# Patient Record
Sex: Female | Born: 1965 | Race: White | Hispanic: No | Marital: Married | Smoking: Former smoker
Health system: Southern US, Community
[De-identification: ages and names within clinical notes are randomized; demographics above are authoritative.]

## PROBLEM LIST (undated history)

## (undated) DIAGNOSIS — E039 Hypothyroidism, unspecified: Secondary | ICD-10-CM

## (undated) DIAGNOSIS — N390 Urinary tract infection, site not specified: Secondary | ICD-10-CM

## (undated) DIAGNOSIS — Z8619 Personal history of other infectious and parasitic diseases: Secondary | ICD-10-CM

## (undated) HISTORY — DX: Personal history of other infectious and parasitic diseases: Z86.19

## (undated) HISTORY — PX: TUMOR REMOVAL: SHX12

## (undated) HISTORY — DX: Urinary tract infection, site not specified: N39.0

## (undated) HISTORY — DX: Hypothyroidism, unspecified: E03.9

## (undated) HISTORY — PX: ILEOSTOMY: SHX1783

---

## 2003-02-27 ENCOUNTER — Emergency Department (HOSPITAL_COMMUNITY): Admission: EM | Admit: 2003-02-27 | Discharge: 2003-02-27 | Payer: Self-pay | Admitting: Emergency Medicine

## 2003-02-28 ENCOUNTER — Emergency Department (HOSPITAL_COMMUNITY): Admission: EM | Admit: 2003-02-28 | Discharge: 2003-02-28 | Payer: Self-pay | Admitting: Emergency Medicine

## 2005-01-08 ENCOUNTER — Emergency Department (HOSPITAL_COMMUNITY): Admission: EM | Admit: 2005-01-08 | Discharge: 2005-01-08 | Payer: Self-pay | Admitting: Emergency Medicine

## 2011-02-22 ENCOUNTER — Emergency Department (HOSPITAL_COMMUNITY)
Admission: EM | Admit: 2011-02-22 | Discharge: 2011-02-22 | Disposition: A | Discharge status: Home / Self Care | Payer: BC Managed Care – PPO | Attending: Emergency Medicine | Admitting: Emergency Medicine

## 2011-02-22 ENCOUNTER — Emergency Department (HOSPITAL_COMMUNITY): Payer: BC Managed Care – PPO

## 2011-02-22 DIAGNOSIS — R143 Flatulence: Secondary | ICD-10-CM | POA: Insufficient documentation

## 2011-02-22 DIAGNOSIS — R141 Gas pain: Secondary | ICD-10-CM | POA: Insufficient documentation

## 2011-02-22 DIAGNOSIS — R109 Unspecified abdominal pain: Secondary | ICD-10-CM | POA: Insufficient documentation

## 2011-02-22 DIAGNOSIS — R142 Eructation: Secondary | ICD-10-CM | POA: Insufficient documentation

## 2011-02-22 DIAGNOSIS — C569 Malignant neoplasm of unspecified ovary: Secondary | ICD-10-CM | POA: Insufficient documentation

## 2011-02-22 DIAGNOSIS — R10815 Periumbilic abdominal tenderness: Secondary | ICD-10-CM | POA: Insufficient documentation

## 2011-02-22 LAB — COMPREHENSIVE METABOLIC PANEL
ALT: 13 U/L (ref 0–35)
Calcium: 9.1 mg/dL (ref 8.4–10.5)
Creatinine, Ser: 0.74 mg/dL (ref 0.4–1.2)
GFR calc Af Amer: >60 mL/min (ref >60)
Glucose, Bld: 96 mg/dL (ref 70–99)
Sodium: 136 mEq/L (ref 135–145)
Total Protein: 6.8 g/dL (ref 6.0–8.3)

## 2011-02-22 LAB — URINALYSIS, ROUTINE W REFLEX MICROSCOPIC
Glucose, UA: NEGATIVE mg/dL
Ketones, ur: NEGATIVE mg/dL
Specific Gravity, Urine: 1.01 (ref 1.005–1.030)
pH: 8 (ref 5.0–8.0)

## 2011-02-22 LAB — CBC
HCT: 42.2 % (ref 36.0–46.0)
MCHC: 35.3 g/dL (ref 30.0–36.0)
RDW: 13.2 % (ref 11.5–15.5)

## 2011-02-22 LAB — DIFFERENTIAL
Basophils Absolute: 0 10*3/uL (ref 0.0–0.1)
Basophils Relative: 0 % (ref 0–1)
Eosinophils Relative: 1 % (ref 0–5)
Monocytes Absolute: 0.3 10*3/uL (ref 0.1–1.0)

## 2011-03-01 ENCOUNTER — Ambulatory Visit: Payer: BC Managed Care – PPO | Attending: Gynecologic Oncology | Admitting: Gynecologic Oncology

## 2011-03-01 ENCOUNTER — Other Ambulatory Visit: Payer: Self-pay | Admitting: Gynecologic Oncology

## 2011-03-01 ENCOUNTER — Other Ambulatory Visit (HOSPITAL_COMMUNITY)
Admission: RE | Admit: 2011-03-01 | Discharge: 2011-03-01 | Disposition: A | Discharge status: Home / Self Care | Payer: BC Managed Care – PPO | Source: Ambulatory Visit | Attending: Gynecologic Oncology | Admitting: Gynecologic Oncology

## 2011-03-01 DIAGNOSIS — Z8540 Personal history of malignant neoplasm of unspecified female genital organ: Secondary | ICD-10-CM | POA: Insufficient documentation

## 2011-03-01 DIAGNOSIS — N814 Uterovaginal prolapse, unspecified: Secondary | ICD-10-CM | POA: Insufficient documentation

## 2011-03-01 DIAGNOSIS — R1909 Other intra-abdominal and pelvic swelling, mass and lump: Secondary | ICD-10-CM | POA: Insufficient documentation

## 2011-03-01 DIAGNOSIS — N390 Urinary tract infection, site not specified: Secondary | ICD-10-CM | POA: Insufficient documentation

## 2011-03-05 NOTE — Consult Note (Signed)
[name], Khan                ACCOUNT NO.:  [id number]  MEDICAL RECORD NO.:  [id number]           PATIENT TYPE:  E  LOCATION:  [facility]                         FACILITY:  [facility]  PHYSICIAN:  Laurette Schimke, MD     DATE OF BIRTH:  [birth year]  DATE OF CONSULTATION: DATE OF DISCHARGE:  02/22/2011                                CONSULTATION   REASONS FOR CONSULT:  Consult is requested by Dr. Asencion Partridge for management of a pelvic mass.  HISTORY OF PRESENT ILLNESS:  This is a 45 year old gravida 2, para 2, last normal menstrual period approximately 1 month ago.  She is in her usual state of health until 6 weeks ago when she developed signs and symptoms of discomfort with increase in weight gain, initially, she presumed this was secondary to recurrent urinary tract infection and sought evaluation as such.  Given the progression of symptoms, a CT scan of the abdomen and pelvis was requested.  This imaging study demonstrated the presence of a 30 x 15 x 26 cm large cystic mass with multiple septations and thin enhancing mural nodules.  The mass compresses both mid to distal ureters resulting in moderate bilateral hydronephrosis.  There is no evidence of ascites or peritoneal nodularity.  No evidence of lymphadenopathy.  Simple cysts were noted within the lower segment of the liver.  [name] denies any nausea or vomiting or diarrhea.  She also denies constipation.  She reports increasing abdominal pressure with increasing difficulty and with mobilization and she has been having worsening pain over the last week.  PAST MEDICAL HISTORY: 1. 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 2. Current urinary tract infection. 3. Nonfunctioning bladder with urinary diversion.  MEDICATIONS: 1. Ciprofloxacin 500 mg daily. 2. Ibuprofen.  ALLERGIES:  AMOXICILLIN, which causes fever and weakness.  PAST SURGICAL HISTORY: 1. Ileal conduit created at the age of [age] secondary to a bladder  fracture.  No revisions have been necessary in the interim. 2. She reports history of appendectomy at the age of [age]. 3. Removal of a spinal tumor remotely.  PAST GYN HISTORY:  Menarche occurred at the age of 32 with regular menses.  Menses are heavier now, but do only last 1 day.  She has had two normal spontaneous vaginal deliveries, the first was 9 pounds the second was 6.3 pounds.  FAMILY HISTORY:  She denies a history of malignancy.  SOCIAL HISTORY:  She is unmarried for 13 years.  She has two children, who are alive and well.  She works as an Nature conservation officer.  REVIEW OF SYSTEMS:  No shortness of breath, cough, chest pain.  She has an ileal conduit, which has been functioning without any revisions.  She has intermittent urinary tract infections.  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  No history of a Pap test for approximately 11 years. Worsening lower abdominal pain, pelvic pressure, discomfort, difficulty ambulating.  PHYSICAL EXAMINATION:  VITAL SIGNS:  Weight 139 pounds, height 5 feet 7 inches, blood pressure 120/68, pulse of 76. GENERAL:  Well-developed and pleasant female, in no acute distress. CHEST:  Clear to auscultation. HEART:  Regular  rate and rhythm. ABDOMEN:  Markedly distended.  Mass palpable approximately 10 cm above the umbilicus.  A left paramedian incision is noted.  A right functioning ileostomy is appreciated. PELVIC EXAMINATION:  The cervix is displaced completely of the side introitus.  There it is reducible; however, patient has great discomfort with relocation of the  uterus and vagina into the pelvis, probably secondary to the presence of this mass.  The vaginal tissues are extremely keratinized.  No erosions or ulcerations are appreciated. Rectal examination is notable for good anal sphincter tone.  No nodular mass is seen.  No nodularity in the rectovaginal septum.  IMPRESSION:  [name] is a 45 year old with a 30-cm adnexal mass  and complete uterine prolapse.  PLAN:  Plan for this patient is for her to be evaluated by Dr. Ellene Route next week with the intent for a total abdominal hysterectomy, bilateral salpingo-oophorectomy with possible staging and other urinary support procedures as indicated on Tuesday, March 13, 2011 at [facility].  I am most appreciative to GYN-urology team for facilitating [place] evaluation on such short note.  Ms. Cafaro was counseled and advised against removal of the other ovary is within normal limits given the concerns regarding with respect to increase in mortality and the other conditions associated with early termination of ovarian function.  She, however, has a lot of apprehension regarding surgical procedures and would like to have one definitive procedure.  Risks of the procedure were discussed with her inclusive of infection, bleeding, damage to surrounding structures, prolonged hospitalization, reoperation.  All of her questions of those of her husband's were answered to satisfaction.     Laurette Schimke, MD     WB/MEDQ  D:  03/01/2011  T:  03/01/2011  Job:  [id number]  cc:   Misty Stanley M.D. Marisue Ivan, M.D.  Telford Nab, R.N. [address] [place], [place] [address]  Ellene Route, M.D.  Electronically Signed by Laurette Schimke MD on 03/05/2011 10:23:21 AM

## 2011-03-14 HISTORY — PX: TOTAL ABDOMINAL HYSTERECTOMY W/ BILATERAL SALPINGOOPHORECTOMY: SHX83

## 2011-10-18 LAB — HM MAMMOGRAPHY

## 2012-03-12 LAB — HM PAP SMEAR

## 2012-03-17 DIAGNOSIS — E039 Hypothyroidism, unspecified: Secondary | ICD-10-CM

## 2012-03-17 HISTORY — DX: Hypothyroidism, unspecified: E03.9

## 2012-04-16 ENCOUNTER — Encounter: Payer: Self-pay | Admitting: Endocrinology

## 2012-04-16 ENCOUNTER — Ambulatory Visit (INDEPENDENT_AMBULATORY_CARE_PROVIDER_SITE_OTHER): Payer: BC Managed Care – PPO | Admitting: Endocrinology

## 2012-04-16 VITALS — BP 112/74 | HR 77 | Temp 98.4°F | Ht 67.0 in | Wt 149.0 lb

## 2012-04-16 DIAGNOSIS — E039 Hypothyroidism, unspecified: Secondary | ICD-10-CM | POA: Insufficient documentation

## 2012-04-16 DIAGNOSIS — D279 Benign neoplasm of unspecified ovary: Secondary | ICD-10-CM

## 2012-04-16 MED ORDER — LEVOTHYROXINE SODIUM 50 MCG PO TABS: 50.0000 ug | ORAL_TABLET | Freq: Every day | ORAL | Status: DC

## 2012-04-16 NOTE — Progress Notes (Signed)
  Subjective:    Patient ID: Nancy Khan, female    DOB: April 09, 1966, 46 y.o.   MRN: [id number]  HPI Pt states a few mos of moderate weight gain, worst at the abdominal area, and assoc tremor of the hands.  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  She had many labs done at [facility] last year, but she is uncertain if they included the thyroid.   Past Medical History  Diagnosis Date  . History of chicken pox   . Hypothyroidism 03/2012  . Frequent UTI   . Spina bifida     Past Surgical History  Procedure Date  . Ileostomy     Age 76  . Total abdominal hysterectomy w/ bilateral salpingoophorectomy 03/14/2011  . Tumor removal     also removed 8 liters of fluid-benign tumor-Dr. Laurette Schimke [facility]    History   Social History  . Marital Status: Married    Spouse Name: N/A    Number of Children: 2  . Years of Education: 12   Occupational History  . Operations Manager    Social History Main Topics  . Smoking status: Former Smoker -- 0.5 packs/day for 20 years  . Smokeless tobacco: Not on file  . Alcohol Use: No  . Drug Use: No  . Sexually Active: Not on file   Other Topics Concern  . Not on file   Social History Narrative   Regular exercise-yesCaffeine Use-yes    Current Outpatient Prescriptions on File Prior to Visit  Medication Sig Dispense Refill  . estradiol (ESTRACE) 1 MG tablet Take 1 mg by mouth daily.         Allergies  Allergen Reactions  . Penicillins     No family history on file. No thyroid probs. BP 112/74  Pulse 77  Temp(Src) 98.4 F (36.9 C) (Oral)  Ht 5\' 7"  (1.702 m)  Wt 149 lb (67.586 kg)  BMI 23.34 kg/m2  SpO2 99%  Review of Systems denies depression, hair loss, cramps, sob, fever, difficulty with concentration, constipation, numbness, myalgias, dry skin, rhinorrhea, easy bruising, and syncope.  She has fatigue, slight intermittent blurry vision, and lightheadedness.    Objective:   Physical Exam VS: see vs page GEN:  no distress HEAD: head: no deformity eyes: no periorbital swelling, no proptosis external nose and ears are normal mouth: no lesion seen NECK: supple, thyroid is not enlarged CHEST WALL: no deformity. LUNGS:  Clear to auscultation. CV: reg rate and rhythm, no murmur. ABD: abdomen is soft, nontender.  no hepatosplenomegaly.  not distended.  no hernia.  Ileostomy and healed surgical scars. MUSCULOSKELETAL: muscle bulk and strength are grossly normal.  no obvious joint swelling.  gait is normal and steady.   EXTEMITIES: no deformity of the hands.  no edema of the legs. PULSES: dorsalis pedis intact bilat.  no carotid bruit NEURO:  cn 2-12 grossly intact.   readily moves all 4's.  sensation is intact to touch on the feet SKIN:  Normal texture and temperature.  No rash or suspicious lesion is visible.   NODES:  None palpable at the neck. PSYCH: alert, oriented x3.  Does not appear anxious nor depressed.  outside test results are reviewed:  TSH: 10.3    Assessment & Plan:  Hypothyroidism, usually due to chronic thyroiditis, new Weight gain.  Uncertain if this is thyroid-related Surgical menopause.  Not thyroid-related, but this can limit interpretation of sxs.

## 2012-04-16 NOTE — Patient Instructions (Addendum)
i have sent a prescription to your pharmacy, for the thyroid.  You will probably need to take this medication for the rest of your life. .   Please redo the thyroid blood tests in 4-6 weeks.  You will receive a letter with results.  Please return in 1 year.

## 2012-06-06 ENCOUNTER — Other Ambulatory Visit (INDEPENDENT_AMBULATORY_CARE_PROVIDER_SITE_OTHER): Payer: BC Managed Care – PPO

## 2012-06-06 DIAGNOSIS — E039 Hypothyroidism, unspecified: Secondary | ICD-10-CM

## 2012-06-06 LAB — TSH: TSH: 5.65 u[IU]/mL — ABNORMAL HIGH (ref 0.35–5.50)

## 2012-06-09 ENCOUNTER — Telehealth: Payer: Self-pay | Admitting: *Deleted

## 2012-06-09 ENCOUNTER — Other Ambulatory Visit: Payer: Self-pay | Admitting: Endocrinology

## 2012-06-09 ENCOUNTER — Encounter: Payer: Self-pay | Admitting: Endocrinology

## 2012-06-09 DIAGNOSIS — E039 Hypothyroidism, unspecified: Secondary | ICD-10-CM

## 2012-06-09 MED ORDER — LEVOTHYROXINE SODIUM 75 MCG PO TABS: 75.0000 ug | ORAL_TABLET | Freq: Every day | ORAL | Status: DC

## 2012-06-09 NOTE — Telephone Encounter (Signed)
Called pt to inform of lab results, pt informed (letter also mailed to pt). 

## 2012-08-14 ENCOUNTER — Encounter: Payer: Self-pay | Admitting: Endocrinology

## 2012-08-14 ENCOUNTER — Other Ambulatory Visit (INDEPENDENT_AMBULATORY_CARE_PROVIDER_SITE_OTHER): Payer: BC Managed Care – PPO

## 2012-08-14 DIAGNOSIS — E039 Hypothyroidism, unspecified: Secondary | ICD-10-CM

## 2012-08-14 LAB — TSH: TSH: 2.8 u[IU]/mL (ref 0.35–5.50)

## 2012-10-20 ENCOUNTER — Telehealth: Payer: Self-pay | Admitting: Endocrinology

## 2012-10-20 ENCOUNTER — Other Ambulatory Visit: Payer: Self-pay

## 2012-10-20 MED ORDER — LEVOTHYROXINE SODIUM 75 MCG PO TABS: 75.0000 ug | ORAL_TABLET | Freq: Every day | ORAL | Status: DC

## 2012-10-20 NOTE — Telephone Encounter (Signed)
Pt called and needs refill on thyroid  Med, she is completely out, pls call patient.

## 2012-10-20 NOTE — Telephone Encounter (Signed)
rx sent to pharmacy

## 2013-03-18 ENCOUNTER — Telehealth: Payer: Self-pay | Admitting: *Deleted

## 2013-03-18 DIAGNOSIS — N951 Menopausal and female climacteric states: Secondary | ICD-10-CM

## 2013-03-18 MED ORDER — ESTRADIOL 1 MG PO TABS: 1.5000 mg | ORAL_TABLET | Freq: Every day | ORAL | Status: DC

## 2013-03-23 NOTE — Telephone Encounter (Signed)
rx refill

## 2013-03-25 MED ORDER — ESTRADIOL 1 MG PO TABS: 1.5000 mg | ORAL_TABLET | Freq: Every day | ORAL | Status: DC

## 2013-03-25 NOTE — Addendum Note (Signed)
Addended by: Antionette Char on: 03/25/2013 08:17 AM   Modules accepted: Orders, Medications

## 2013-06-15 ENCOUNTER — Ambulatory Visit (INDEPENDENT_AMBULATORY_CARE_PROVIDER_SITE_OTHER): Payer: BC Managed Care – PPO | Admitting: Obstetrics & Gynecology

## 2013-06-15 ENCOUNTER — Encounter: Payer: Self-pay | Admitting: Obstetrics & Gynecology

## 2013-06-15 VITALS — BP 126/81 | HR 79 | Temp 97.4°F | Ht 68.0 in | Wt 147.0 lb

## 2013-06-15 DIAGNOSIS — Z01419 Encounter for gynecological examination (general) (routine) without abnormal findings: Secondary | ICD-10-CM

## 2013-06-15 NOTE — Progress Notes (Signed)
Subjective:     Nancy Khan is a 47 y.o. female here for a routine exam.  Current complaints: Patient in office annual exam- patient states she has leg pain at night,possibly from mesh . Patient reports she does have hot flashes on her HRT. Personal health questionnaire reviewed: no.   Gynecologic History No LMP recorded. Patient has had a hysterectomy. Contraception: none Last Pap: 2013. Results were: normal Last mammogram: 2013. Results were: normal  Obstetric History OB History   Grav Para Term Preterm Abortions TAB SAB Ect Mult Living                   The following portions of the patient's history were reviewed and updated as appropriate: allergies, current medications, past family history, past medical history, past social history, past surgical history and problem list.  Review of Systems Pertinent items are noted in HPI.    Objective:      General:  alert  Breast No masses, no discharge, NT  Abdomen: soft, non-tender; bowel sounds normal; no masses,  no organomegaly   Vulva:  normal  Vagina: Rectocele present; vaginal apex well supported             Assessment:   Menopausal symptoms Noncompliant w/thyroid replacement medications Stage 2 rectocele Plan:    Review w/Dr. Rockwell [place] if horseback riding OK s/p sacrocolpopexy Continue ERT Return prn

## 2013-06-15 NOTE — Addendum Note (Signed)
Addended by: Julaine Hua on: 06/15/2013 04:43 PM   Modules accepted: Orders

## 2013-06-15 NOTE — Patient Instructions (Signed)
Patient information: Hypothyroidism (underactive thyroid) (Beyond the Basics)  Author Georges Mouse, MD Section Editor Bari Mantis, MD Deputy Editor Sherrin Daisy, MD Disclosures: Georges Mouse, MD Grant/Research/Clinical Trial Support: Blase Mess [thyroid cancer (rhTSH)]. Bari Mantis, MD Nothing to disclose. Sherrin Daisy, MD Employee of [employer]  Contributor disclosures are reviewed for conflicts of interest by the editorial group. When found, these are addressed by vetting through a multi-level review process, and through requirements for references to be provided to support the content. Appropriately referenced content is required of all authors and must conform to [employer] standards of evidence.  Conflict of interest policy  All topics are updated as new evidence becomes available and our peer review process is complete.  Literature review current through: May 2014.  This topic last updated: Nov 20, 2012.  HYPOTHYROIDISM OVERVIEW - Hypothyroidism is a condition in which the thyroid gland does not produce enough thyroid hormone. It is the most common thyroid disorder. This topic discusses HYPOthyroidism. HYPERthyroidism is discussed separately. (See "Patient information: Hyperthyroidism (overactive thyroid) (Beyond the Basics)".) WHAT IS THE THYROID? - The thyroid is a butterfly-shaped gland in the middle of the neck, located below the larynx (voice box) and above the clavicles (collarbones) (figure 1). The thyroid produce two hormones, triiodothyronine (T3) and thyroxine (T4), which regulate how the body uses and stores energy (also known as the body's metabolism). Thyroid function is controlled by a gland in the brain, known as the pituitary (figure 2). The pituitary produces thyroid stimulating hormone (TSH), which stimulates the thyroid to produce T3 and T4. HYPOTHYROIDISM CAUSES - In about 95 percent of cases, hypothyroidism is due to a problem in the thyroid gland itself and is  called primary hypothyroidism. However, certain medications and diseases can also decrease thyroid function. As an example, HYPOthyroidism can also develop after medical treatments for HYPERthyroidism, such as thyroidectomy (surgical removal of the thyroid) or radioactive iodine treatment (to destroy thyroid tissue). In some cases, hypothyroidism is a result of decreased production of thyroid-stimulating hormone (TSH) by the pituitary gland (called secondary hypothyroidism). (See "Patient information: Hyperthyroidism (overactive thyroid) (Beyond the Basics)".) Thyroid problems are more common in women, increase with age, and are more common in whites and [place] Americans than in blacks. HYPOTHYROIDISM SYMPTOMS - The symptoms of hypothyroidism vary widely; some people have no symptoms while others have dramatic symptoms or, rarely, life-threatening symptoms. The symptoms of hypothyroidism are notorious for being nonspecific and for mimicking many of the normal changes of aging. Usually, symptoms are milder when hypothyroidism develops gradually. Symptoms, when caused by hypothyroidism, generally are related to the degree of hypothyroidism. Many patients with mild hypothyroidism are identified on screening tests for potential hypothyroid symptoms, but have few or no symptoms that ultimately are attributed to hypothyroidism or respond to treatment of hypothyroidism. In contrast, patients with moderate to severe hypothyroidism are usually symptomatic and improve significantly with thyroid hormone replacement. General symptoms - Thyroid hormone normally stimulates the metabolism, and most of the symptoms of hypothyroidism reflect slowing of metabolic processes. General symptoms may include fatigue, sluggishness, weight gain, and intolerance of cold temperatures. Skin - Hypothyroidism can decrease sweating. The skin may become dry and thick. The hair may become coarse or thin, eyebrows may disappear, and nails may  become brittle. Eyes - Hypothyroidism can lead to mild swelling around the eyes. People who develop hypothyroidism after treatment for Graves' disease may retain some of the eye symptoms of Graves' disease, including protrusion of the eyes,  the appearance of staring, and impaired movement of the eyes. (See "Patient information: Hyperthyroidism (overactive thyroid) (Beyond the Basics)".) Cardiovascular system - Hypothyroidism slows the heart rate and weakens the heart's contractions, decreasing its overall function. Related symptoms may include fatigue and shortness of breath with exercise. These symptoms may be more severe in people who also have heart disease. In addition, hypothyroidism can cause mild high blood pressure and raise blood levels of cholesterol. Respiratory system - Hypothyroidism weakens the respiratory muscles and decreases lung function. Symptoms can include fatigue, shortness of breath with exercise, and decreased ability to exercise. Hypothyroidism can also lead to swelling of the tongue, hoarse voice, and sleep apnea. Sleep apnea is a condition in which there is intermittent blockage of the airway while sleeping, causing fitful sleep and daytime sleepiness. (See "Patient information: Sleep apnea in adults (Beyond the Basics)".) Gastrointestinal system - Hypothyroidism slows the actions of the digestive tract, causing constipation. Rarely, the digestive tract may stop moving entirely. (See "Patient information: Constipation in adults (Beyond the Basics)".) Reproductive system - Women with hypothyroidism often have menstrual cycle irregularities, ranging from absent or infrequent periods to very frequent and heavy periods. The menstrual irregularities can make it difficult to become pregnant, and pregnant women with hypothyroidism have an increased risk for miscarriage during early pregnancy. Treatment of hypothyroidism can decrease these risks. (See "Patient information: Absent or irregular  periods (Beyond the Basics)" and "Patient information: Heavy or prolonged periods (menorrhagia) (Beyond the Basics)".) Myxedema coma - In people with severe hypothyroidism, trauma, infection, exposure to the cold, and certain medications can rarely trigger a life-threatening condition called myxedema coma, which causes a loss of consciousness and hypothermia (low body temperature). HYPOTHYROIDISM DIAGNOSIS - In the past, hypothyroidism was not diagnosed until symptoms had been present for a long time. However, simple blood tests can now detect hypothyroidism at an early stage. A person may be tested for hypothyroidism if there are signs and symptoms, such as those discussed above, or as a screening test. Blood tests - Blood tests can confirm the diagnosis and pinpoint the underlying cause of the thyroid hormone deficiency. The most common blood test for hypothyroidism is TSH (thyroid stimulating hormone). TSH is the most sensitive test because it can be elevated even with small decreases in thyroid function. Thyroxine (T4), the main product of the thyroid gland, may also be measured to confirm and assess the degree of hypothyroidism. Routine screening - All newborn babies in the [place] are routinely screened for thyroid hormone deficiency. It is not clear if all adults should be tested for thyroid disease [1]. HYPOTHYROIDISM TREATMENT - The goal of treatment for hypothyroidism is to return blood levels of TSH and T4 to the normal range and to alleviate symptoms. Medication - The treatment for hypothyroidism is thyroid hormone replacement therapy. This is usually given as an oral form of T4. T4 should be taken once per day on an empty stomach (one hour before eating or two hours after). Generic (levothyroxine) and brand-name (Synthroid, Levoxyl, Levothroid, Unithyroid, Tirosint) formulations are equally effective. However, it is preferable to stay on the same type of T4 rather than switching between  brand name and/or generic formulations. If a switch is necessary, a blood test is usually done six weeks later to determine if the dose needs to be adjusted. Color-coded tablets can help with dose adjustments. Some clinicians prescribe another form of thyroid hormone, triiodothyronine (T3) in combination with T4. However, since T4 is converted into T3 in other organs,  the majority of studies have not shown an advantage of combination T3 and T4 therapy over T4 alone. In most cases, symptoms of hypothyroidism begin to improve within two weeks of starting thyroid replacement therapy. However, people with more severe symptoms may require several months of treatment before they fully recover. Duration and dose - A healthcare provider will prescribe an initial dose of T4 and then retest the blood level of TSH after six weeks. The T4 dose can be adjusted at that time, depending upon these results. This process may be repeated several times before hormone levels become normal. After the optimal dose is identified, a provider may recommend monitoring blood tests once yearly, or more often as needed. Most people with hypothyroidism require lifelong treatment, although the dose of T4 may need to be adjusted over time. Never increase or decrease the T4 dose without first consulting a healthcare provider. Overreplacement of T4 can cause mild hyperthyroidism, with the associated dangers of atrial fibrillation (irregular heart beat) and, possibly, accelerated bone loss (osteoporosis). Dose changes - Changes in the T4 dose are based upon the person's TSH and T4 level. The dose may need to be increased if thyroid disease worsens, during pregnancy, if gastrointestinal conditions impair T4 absorption, or if the person gains weight. A high fiber diet, calcium- or aluminum-containing antacids, and iron tablets can interfere with the absorption of T4 and should be taken at a different time of day. The dose may need to be decreased  as the person gets older, after childbirth, or if the person loses weight. Monitoring - Individual T4 doses can vary widely and depend upon a variety of factors, including the underlying cause of hypothyroidism. People with certain conditions require more frequent monitoring. Advanced age and heart disease - Thyroid hormone makes the heart work a bit harder. Therefore, a clinician may opt for more conservative T4 treatment in older adults and in people with coronary artery disease. Pregnancy - Women often need higher doses of T4 during pregnancy. Testing is usually recommended every four weeks, beginning after conception. Once the optimal T4 dose is established, testing is usually repeated at least once per trimester. After delivery, the woman's dose of T4 will need to be adjusted again. Surgery - Hypothyroidism can increase the risk of certain surgery-related complications; bowel function may be slow to recover and infection may be overlooked if there is no fever. If pre-operative blood tests reveal low thyroid hormone levels, non-emergency surgery is usually postponed until treatment has returned T4 levels to normal. Hypothyroidism without symptoms - In some cases, hypothyroidism is extremely mild or causes no obvious symptoms (called subclinical hypothyroidism). The decision to treat subclinical hypothyroidism with T4 is controversial. Many experts treat patients with subclinical hypothyroidism if their TSH is >10 mU/L to prevent the development of hypothyroidism and associated symptoms. Treatment is also recommended for people who have a goiter or nonspecific symptoms of hypothyroidism, such as fatigue, constipation, or depression.

## 2014-02-23 ENCOUNTER — Encounter: Payer: Self-pay | Admitting: Endocrinology

## 2014-02-23 ENCOUNTER — Ambulatory Visit (INDEPENDENT_AMBULATORY_CARE_PROVIDER_SITE_OTHER): Payer: BC Managed Care – PPO | Admitting: Endocrinology

## 2014-02-23 VITALS — BP 128/78 | HR 81 | Temp 97.8°F | Resp 14 | Ht 67.0 in | Wt 153.4 lb

## 2014-02-23 DIAGNOSIS — H9311 Tinnitus, right ear: Secondary | ICD-10-CM | POA: Insufficient documentation

## 2014-02-23 DIAGNOSIS — E039 Hypothyroidism, unspecified: Secondary | ICD-10-CM

## 2014-02-23 DIAGNOSIS — H9319 Tinnitus, unspecified ear: Secondary | ICD-10-CM

## 2014-02-23 LAB — TSH: TSH: 4.42 u[IU]/mL (ref 0.35–5.50)

## 2014-02-23 NOTE — Progress Notes (Signed)
   Subjective:    Patient ID: Nancy Khan, female    DOB: [birth year], 48 y.o.   MRN: [id number]  HPI Pt returns for f/u of chronic primary hypothyroidism (dx'ed 2013).  She took synthroid until late 2014, when she stopped, due to dizziness.  sxs then resolved.  She has moderate tinnitus from the right ear, and intermittent assoc pain.   Past Medical History  Diagnosis Date  . History of chicken pox   . Hypothyroidism 03/2012  . Frequent UTI   . Spina bifida     Past Surgical History  Procedure Laterality Date  . Ileostomy      Age 11  . Total abdominal hysterectomy w/ bilateral salpingoophorectomy  03/14/2011  . Tumor removal      also removed 8 liters of fluid-benign tumor-Dr. Janie Morning [facility]    History   Social History  . Marital Status: Married    Spouse Name: N/A    Number of Children: 2  . Years of Education: 12   Occupational History  . Operations Manager    Social History Main Topics  . Smoking status: Light Tobacco Smoker -- 0.25 packs/day for 20 years  . Smokeless tobacco: Not on file  . Alcohol Use: No  . Drug Use: No  . Sexual Activity: Yes    Partners: Male    Birth Control/ Protection: Surgical   Other Topics Concern  . Not on file   Social History Narrative   Regular exercise-yes   Caffeine Use-yes    Current Outpatient Prescriptions on File Prior to Visit  Medication Sig Dispense Refill  . calcium carbonate (OS-CAL) 600 MG TABS Take 600 mg by mouth 2 (two) times daily with a meal.      . estradiol (ESTRACE) 1 MG tablet Take 1.5 tablets (1.5 mg total) by mouth daily.  60 tablet  5  . Multiple Vitamins-Minerals (MULTIVITAMIN WITH MINERALS) tablet Take 1 tablet by mouth daily.       No current facility-administered medications on file prior to visit.    Allergies  Allergen Reactions  . Penicillins     No family history on file.  BP 128/78  Pulse 81  Temp(Src) 97.8 F (36.6 C)  Resp 14  Ht 5\' 7"  (1.702 m)  Wt 153 lb 6.4  oz (69.582 kg)  BMI 24.02 kg/m2  SpO2 96%  Review of Systems Denies LOC.  She has gained weight.    Objective:   Physical Exam VITAL SIGNS:  See vs page GENERAL: no distress NECK: There is no palpable thyroid enlargement.  No thyroid nodule is palpable.  No palpable lymphadenopathy at the anterior neck. Right external ear, EAC, and TM are normal.   Lab Results  Component Value Date   TSH 4.42 02/23/2014      Assessment & Plan:  Hypothyroidism: now euthyroid off of any rx. Weight gain: not thyroid-related Tinnitus: new, uncertain etiology Dizziness, uncertain etiology

## 2014-02-23 NOTE — Patient Instructions (Addendum)
blood tests are being requested for you today.  We'll contact you with results. Refer to an ear specialist.  you will receive a phone call, about a day and time for an appointment.

## 2014-02-24 ENCOUNTER — Telehealth: Payer: Self-pay

## 2014-02-24 ENCOUNTER — Telehealth: Payer: Self-pay | Admitting: *Deleted

## 2014-02-24 MED ORDER — LEVOTHYROXINE SODIUM 25 MCG PO TABS: 25.0000 ug | ORAL_TABLET | Freq: Every day | ORAL | Status: DC

## 2014-02-24 NOTE — Telephone Encounter (Signed)
Pt advised.

## 2014-02-24 NOTE — Telephone Encounter (Signed)
Pt called stating that she was concerned about not being on her Thyroid medication. Pt has been advised that her lab results were normal and that no treatment is needed now. Pt states that she has no energy and has been putting on weight. She states when she was on her medicine she felt fine, but now she does not and states that something is wrong.  Pleas advise,  Thanks!

## 2014-02-24 NOTE — Telephone Encounter (Signed)
Error

## 2014-02-24 NOTE — Telephone Encounter (Signed)
We can try a low dosage.  i have sent a prescription to your pharmacy. Please redo the blood test in 1 month.

## 2014-03-03 ENCOUNTER — Other Ambulatory Visit: Payer: Self-pay | Admitting: Otolaryngology

## 2014-03-03 DIAGNOSIS — H9190 Unspecified hearing loss, unspecified ear: Secondary | ICD-10-CM

## 2014-03-09 ENCOUNTER — Ambulatory Visit
Admission: RE | Admit: 2014-03-09 | Discharge: 2014-03-09 | Disposition: A | Discharge status: Home / Self Care | Payer: BC Managed Care – PPO | Source: Ambulatory Visit | Attending: Otolaryngology | Admitting: Otolaryngology

## 2014-03-09 DIAGNOSIS — H9190 Unspecified hearing loss, unspecified ear: Secondary | ICD-10-CM

## 2014-03-09 MED ORDER — GADOBENATE DIMEGLUMINE 529 MG/ML IV SOLN
14.0000 mL | Freq: Once | INTRAVENOUS | Status: AC | PRN
  Administered 2014-03-09: 14 mL via INTRAVENOUS

## 2014-03-16 ENCOUNTER — Other Ambulatory Visit: Payer: Self-pay | Admitting: *Deleted

## 2014-03-16 DIAGNOSIS — N951 Menopausal and female climacteric states: Secondary | ICD-10-CM

## 2014-03-16 MED ORDER — ESTRADIOL 1 MG PO TABS: 1.5000 mg | ORAL_TABLET | Freq: Every day | ORAL | Status: DC

## 2014-03-17 ENCOUNTER — Encounter: Payer: Self-pay | Admitting: Obstetrics & Gynecology

## 2014-03-24 ENCOUNTER — Encounter: Payer: Self-pay | Admitting: Obstetrics & Gynecology

## 2014-06-30 ENCOUNTER — Encounter: Payer: Self-pay | Admitting: Obstetrics & Gynecology

## 2014-06-30 ENCOUNTER — Ambulatory Visit (INDEPENDENT_AMBULATORY_CARE_PROVIDER_SITE_OTHER): Payer: BC Managed Care – PPO | Admitting: Obstetrics & Gynecology

## 2014-06-30 ENCOUNTER — Other Ambulatory Visit: Payer: Self-pay | Admitting: Obstetrics & Gynecology

## 2014-06-30 VITALS — BP 134/82 | HR 82 | Temp 98.1°F | Wt 151.0 lb

## 2014-06-30 DIAGNOSIS — N951 Menopausal and female climacteric states: Secondary | ICD-10-CM

## 2014-06-30 DIAGNOSIS — Z01419 Encounter for gynecological examination (general) (routine) without abnormal findings: Secondary | ICD-10-CM

## 2014-06-30 MED ORDER — ESTRADIOL 1 MG PO TABS: 0.5000 mg | ORAL_TABLET | Freq: Every day | ORAL | Status: DC

## 2014-07-01 LAB — T3: T3, Total: 92.8 ng/dL (ref 80.0–204.0)

## 2014-07-01 LAB — TSH: TSH: 6.847 u[IU]/mL — ABNORMAL HIGH (ref 0.350–4.500)

## 2014-07-01 LAB — T4, FREE: FREE T4: 0.92 ng/dL (ref 0.80–1.80)

## 2014-07-01 NOTE — Progress Notes (Signed)
Subjective:     Nancy Khan is a 48 y.o. female here for a routine exam.  Current complaints: fatigue, changes in hair.    Personal health questionnaire:  Is patient Ashkenazi Jewish, have a family history  of breast and/or ovarian cancer: no Is there a family history of uterine cancer diagnosed at age < 76, gastrointestinal cancer, urinary tract cancer, family member who is a Field seismologist syndrome-associated carrier: no Is the patient overweight and hypertensive, family history of diabetes, personal history of gestational diabetes or PCOS: no Is patient over 81, have PCOS,  family history of premature CHD under age 92, diabetes, smoke, have hypertension or peripheral artery disease:  no  The HPI was reviewed and explored in further detail by the provider. Gynecologic History No LMP recorded. Patient has had a hysterectomy.   Obstetric History OB History  No data available    Past Medical History  Diagnosis Date  . History of chicken pox   . Hypothyroidism 03/2012  . Frequent UTI   . Spina bifida     Past Surgical History  Procedure Laterality Date  . Ileostomy      Age 84  . Total abdominal hysterectomy w/ bilateral salpingoophorectomy  03/14/2011  . Tumor removal      also removed 8 liters of fluid-benign tumor-Dr. Janie Morning [facility]    Current outpatient prescriptions:calcium carbonate (OS-CAL) 600 MG TABS, Take 600 mg by mouth 2 (two) times daily with a meal., Disp: , Rfl: ;  estradiol (ESTRACE) 1 MG tablet, Take 0.5 tablets (0.5 mg total) by mouth daily., Disp: 60 tablet, Rfl: 3;  levothyroxine (SYNTHROID, LEVOTHROID) 25 MCG tablet, Take 1 tablet (25 mcg total) by mouth daily before breakfast., Disp: 30 tablet, Rfl: 11 Multiple Vitamins-Minerals (MULTIVITAMIN WITH MINERALS) tablet, Take 1 tablet by mouth daily., Disp: , Rfl:  Allergies  Allergen Reactions  . Penicillins     History  Substance Use Topics  . Smoking status: Light Tobacco Smoker -- 0.25 packs/day for 20  years  . Smokeless tobacco: Not on file  . Alcohol Use: No    History reviewed. No pertinent family history.    Review of Systems  Constitutional: positive for fatigue  Respiratory: negative for cough and wheezing Cardiovascular: negative for chest pain, fatigue and palpitations Gastrointestinal: negative for abdominal pain and change in bowel habits Musculoskeletal:negative for myalgias Neurological: negative for gait problems and tremors Behavioral/Psych: negative for abusive relationship, depression Endocrine: negative for temperature intolerance   Genitourinary:negative for  genital lesions, hot flashes and vaginal discharge Integument/breast: negative for breast lump, breast tenderness, nipple discharge and skin lesion(s); positive for change in hair    Objective:       BP 134/82  Pulse 82  Temp(Src) 98.1 F (36.7 C)  Wt 68.493 kg (151 lb) General:   alert  Skin:   no rash or abnormalities  Lungs:   clear to auscultation bilaterally  Heart:   regular rate and rhythm, S1, S2 normal, no murmur, click, rub or gallop  Breasts:   normal without suspicious masses, skin or nipple changes or axillary nodes  Abdomen:  normal findings: no organomegaly, soft, non-tender and no hernia; stoma present  Pelvis:  External genitalia: normal general appearance Urinary system: urethral meatus normal and bladder without fullness, nontender Vaginal: normal without tenderness, induration or masses; large rectocele Adnexa: normal bimanual exam    Lab Review  Labs reviewed yes Radiologic studies reviewed no    Assessment:    Healthy female exam.  Menopausal after hysterectomy on oral ERT--no change in risk factors Plan:    Meds ordered this encounter  Medications  . estradiol (ESTRACE) 1 MG tablet    Sig: Take 0.5 tablets (0.5 mg total) by mouth daily.    Dispense:  60 tablet    Refill:  3   Orders Placed This Encounter  Procedures  . T4, free  . T3  . TSH   Possible  management options include: continue calcium supplement, weight bearing exercise, mammogram Follow up as needed.

## 2014-07-02 NOTE — Patient Instructions (Signed)
Bone Health Our bones do many things. They provide structure, protect organs, anchor muscles, and store calcium. Adequate calcium in your diet and weight-bearing physical activity help build strong bones, improve bone amounts, and may reduce the risk of weakening of bones (osteoporosis) later in life. PEAK BONE MASS By age 50, the average woman has acquired most of her skeletal bone mass. A large decline occurs in older adults which increases the risk of osteoporosis. In women this occurs around the time of menopause. It is important for young girls to reach their peak bone mass in order to maintain bone health throughout life. A person with high bone mass as a young adult will be more likely to have a higher bone mass later in life. Not enough calcium consumption and physical activity early on could result in a failure to achieve optimum bone mass in adulthood. OSTEOPOROSIS Osteoporosis is a disease of the bones. It is defined as low bone mass with deterioration of bone structure. Osteoporosis leads to an increase risk of fractures with falls. These fractures commonly happen in the wrist, hip, and spine. While men and women of all ages and background can develop osteoporosis, some of the risk factors for osteoporosis are:  Female.  White.  Postmenopausal.  Older adults.  Small in body size.  Eating a diet low in calcium.  Physically inactive.  Smoking.  Use of some medications.  Family history. CALCIUM Calcium is a mineral needed by the body for healthy bones, teeth, and proper function of the heart, muscles, and nerves. The body cannot produce calcium so it must be absorbed through food. Good sources of calcium include:  Dairy products (low fat or nonfat milk, cheese, and yogurt).  Dark green leafy vegetables (bok choy and broccoli).  Calcium fortified foods (orange juice, cereal, bread, soy beverages, and tofu products).  Nuts (almonds). Recommended amounts of calcium vary  for individuals. RECOMMENDED CALCIUM INTAKES Age and Amount in mg per day  Children 1 to 3 years / 700 mg  Children 4 to 8 years / 1,000 mg  Children 9 to 13 years / 1,300 mg  Teens 14 to 18 years / 1,300 mg  Adults 19 to 50 years / 1,000 mg  Adult women 51 to 70 years / 1,200 mg  Adults 71 years and older / 1,200 mg  Pregnant and breastfeeding teens / 1,300 mg  Pregnant and breastfeeding adults / 1,000 mg Vitamin D also plays an important role in healthy bone development. Vitamin D helps in the absorption of calcium. WEIGHT-BEARING PHYSICAL ACTIVITY Regular physical activity has many positive health benefits. Benefits include strong bones. Weight-bearing physical activity early in life is important in reaching peak bone mass. Weight-bearing physical activities cause muscles and bones to work against gravity. Some examples of weight bearing physical activities include:  Walking, jogging, or running.  Field Hockey.  Jumping rope.  Dancing.  Soccer.  Tennis or Racquetball.  Stair climbing.  Basketball.  Hiking.  Weight lifting.  Aerobic fitness classes. Including weight-bearing physical activity into an exercise plan is a great way to keep bones healthy. Adults: Engage in at least 30 minutes of moderate physical activity on most, preferably all, days of the week. Children: Engage in at least 60 minutes of moderate physical activity on most, preferably all, days of the week. FOR MORE INFORMATION [place] Department of Agriculture, Center for Nutrition Policy and Promotion: www.cnpp.usda.gov National Osteoporosis Foundation: www.nof.org Document Released: 02/23/2004 Document Revised: 03/30/2013 Document Reviewed: 05/25/2009 Exit[employer] Patient Information   2015 [employer]. This information is not intended to replace advice given to you by your health care provider. Make sure you discuss any questions you have with your health care provider. Health Maintenance,  Female A healthy lifestyle and preventative care can promote health and wellness.  Maintain regular health, dental, and eye exams.  Eat a healthy diet. Foods like vegetables, fruits, whole grains, low-fat dairy products, and lean protein foods contain the nutrients you need without too many calories. Decrease your intake of foods high in solid fats, added sugars, and salt. Get information about a proper diet from your caregiver, if necessary.  Regular physical exercise is one of the most important things you can do for your health. Most adults should get at least 150 minutes of moderate-intensity exercise (any activity that increases your heart rate and causes you to sweat) each week. In addition, most adults need muscle-strengthening exercises on 2 or more days a week.   Maintain a healthy weight. The body mass index (BMI) is a screening tool to identify possible weight problems. It provides an estimate of body fat based on height and weight. Your caregiver can help determine your BMI, and can help you achieve or maintain a healthy weight. For adults 20 years and older:  A BMI below 18.5 is considered underweight.  A BMI of 18.5 to 24.9 is normal.  A BMI of 25 to 29.9 is considered overweight.  A BMI of 30 and above is considered obese.  Maintain normal blood lipids and cholesterol by exercising and minimizing your intake of saturated fat. Eat a balanced diet with plenty of fruits and vegetables. Blood tests for lipids and cholesterol should begin at age 97 and be repeated every 5 years. If your lipid or cholesterol levels are high, you are over 50, or you are a high risk for heart disease, you may need your cholesterol levels checked more frequently.Ongoing high lipid and cholesterol levels should be treated with medicines if diet and exercise are not effective.  If you smoke, find out from your caregiver how to quit. If you do not use tobacco, do not start.  Lung cancer screening is  recommended for adults aged 69-80 years who are at high risk for developing lung cancer because of a history of smoking. Yearly low-dose computed tomography (CT) is recommended for people who have at least a 30-pack-year history of smoking and are a current smoker or have quit within the past 15 years. A pack year of smoking is smoking an average of 1 pack of cigarettes a day for 1 year (for example: 1 pack a day for 30 years or 2 packs a day for 15 years). Yearly screening should continue until the smoker has stopped smoking for at least 15 years. Yearly screening should also be stopped for people who develop a health problem that would prevent them from having lung cancer treatment.  If you are pregnant, do not drink alcohol. If you are breastfeeding, be very cautious about drinking alcohol. If you are not pregnant and choose to drink alcohol, do not exceed 1 drink per day. One drink is considered to be 12 ounces (355 mL) of beer, 5 ounces (148 mL) of wine, or 1.5 ounces (44 mL) of liquor.  Avoid use of street drugs. Do not share needles with anyone. Ask for help if you need support or instructions about stopping the use of drugs.  High blood pressure causes heart disease and increases the risk of stroke. Blood pressure should  be checked at least every 1 to 2 years. Ongoing high blood pressure should be treated with medicines, if weight loss and exercise are not effective.  If you are 82 to 50 years old, ask your caregiver if you should take aspirin to prevent strokes.  Diabetes screening involves taking a blood sample to check your fasting blood sugar level. This should be done once every 3 years, after age 85, if you are within normal weight and without risk factors for diabetes. Testing should be considered at a younger age or be carried out more frequently if you are overweight and have at least 1 risk factor for diabetes.  Breast cancer screening is essential preventative care for women. You  should practice "breast self-awareness." This means understanding the normal appearance and feel of your breasts and may include breast self-examination. Any changes detected, no matter how small, should be reported to a caregiver. Women in their 61s and 30s should have a clinical breast exam (CBE) by a caregiver as part of a regular health exam every 1 to 3 years. After age 39, women should have a CBE every year. Starting at age 54, women should consider having a mammogram (breast X-ray) every year. Women who have a family history of breast cancer should talk to their caregiver about genetic screening. Women at a high risk of breast cancer should talk to their caregiver about having an MRI and a mammogram every year.  Breast cancer gene (BRCA)-related cancer risk assessment is recommended for women who have family members with BRCA-related cancers. BRCA-related cancers include breast, ovarian, tubal, and peritoneal cancers. Having family members with these cancers may be associated with an increased risk for harmful changes (mutations) in the breast cancer genes BRCA1 and BRCA2. Results of the assessment will determine the need for genetic counseling and BRCA1 and BRCA2 testing.  The Pap test is a screening test for cervical cancer. Women should have a Pap test starting at age 93. Between ages 27 and 3, Pap tests should be repeated every 2 years. Beginning at age 19, you should have a Pap test every 3 years as long as the past 3 Pap tests have been normal. If you had a hysterectomy for a problem that was not cancer or a condition that could lead to cancer, then you no longer need Pap tests. If you are between ages 71 and 69, and you have had normal Pap tests going back 10 years, you no longer need Pap tests. If you have had past treatment for cervical cancer or a condition that could lead to cancer, you need Pap tests and screening for cancer for at least 20 years after your treatment. If Pap tests have been  discontinued, risk factors (such as a new sexual partner) need to be reassessed to determine if screening should be resumed. Some women have medical problems that increase the chance of getting cervical cancer. In these cases, your caregiver may recommend more frequent screening and Pap tests.  The human papillomavirus (HPV) test is an additional test that may be used for cervical cancer screening. The HPV test looks for the virus that can cause the cell changes on the cervix. The cells collected during the Pap test can be tested for HPV. The HPV test could be used to screen women aged 68 years and older, and should be used in women of any age who have unclear Pap test results. After the age of 57, women should have HPV testing at the same frequency  as a Pap test.  Colorectal cancer can be detected and often prevented. Most routine colorectal cancer screening begins at the age of 74 and continues through age 21. However, your caregiver may recommend screening at an earlier age if you have risk factors for colon cancer. On a yearly basis, your caregiver may provide home test kits to check for hidden blood in the stool. Use of a small camera at the end of a tube, to directly examine the colon (sigmoidoscopy or colonoscopy), can detect the earliest forms of colorectal cancer. Talk to your caregiver about this at age 38, when routine screening begins. Direct examination of the colon should be repeated every 5 to 10 years through age 19, unless early forms of pre-cancerous polyps or small growths are found.  Hepatitis C blood testing is recommended for all people born from [birth year] through 1967 and any individual with known risks for hepatitis C.  Practice safe sex. Use condoms and avoid high-risk sexual practices to reduce the spread of sexually transmitted infections (STIs). Sexually active women aged 54 and younger should be checked for Chlamydia, which is a common sexually transmitted infection. Older women with  new or multiple partners should also be tested for Chlamydia. Testing for other STIs is recommended if you are sexually active and at increased risk.  Osteoporosis is a disease in which the bones lose minerals and strength with aging. This can result in serious bone fractures. The risk of osteoporosis can be identified using a bone density scan. Women ages 7 and over and women at risk for fractures or osteoporosis should discuss screening with their caregivers. Ask your caregiver whether you should be taking a calcium supplement or vitamin D to reduce the rate of osteoporosis.  Menopause can be associated with physical symptoms and risks. Hormone replacement therapy is available to decrease symptoms and risks. You should talk to your caregiver about whether hormone replacement therapy is right for you.  Use sunscreen. Apply sunscreen liberally and repeatedly throughout the day. You should seek shade when your shadow is shorter than you. Protect yourself by wearing long sleeves, pants, a wide-brimmed hat, and sunglasses year round, whenever you are outdoors.  Notify your caregiver of new moles or changes in moles, especially if there is a change in shape or color. Also notify your caregiver if a mole is larger than the size of a pencil eraser.  Stay current with your immunizations. Document Released: 06/18/2011 Document Revised: 03/30/2013 Document Reviewed: 11/04/2013 [facility] Patient Information 2015 [place], [place]. This information is not intended to replace advice given to you by your health care provider. Make sure you discuss any questions you have with your health care provider.

## 2014-07-06 LAB — THYROID PEROXIDASE ANTIBODY: Thyroperoxidase Ab SerPl-aCnc: 470 IU/mL — ABNORMAL HIGH (ref <35.0)

## 2014-07-07 ENCOUNTER — Telehealth: Payer: Self-pay | Admitting: *Deleted

## 2014-07-07 NOTE — Telephone Encounter (Signed)
Patient is calling for the results of her lab test. Patient informed of results and that there was an additional test added. Will check with the doctor and let her know.

## 2014-07-29 ENCOUNTER — Other Ambulatory Visit: Payer: Self-pay | Admitting: *Deleted

## 2014-07-29 DIAGNOSIS — Z7689 Persons encountering health services in other specified circumstances: Secondary | ICD-10-CM

## 2014-07-30 ENCOUNTER — Telehealth: Payer: Self-pay

## 2014-07-30 NOTE — Telephone Encounter (Signed)
poke with patient and let her know I faxed over referral and notes to Dr. Almetta Lovely office at [facility] today

## 2014-09-28 ENCOUNTER — Telehealth: Payer: Self-pay | Admitting: *Deleted

## 2014-09-28 NOTE — Telephone Encounter (Signed)
Patient called requesting a refill of her Estradiol.

## 2014-10-02 NOTE — Telephone Encounter (Signed)
OK to refill

## 2014-10-06 ENCOUNTER — Other Ambulatory Visit: Payer: Self-pay | Admitting: *Deleted

## 2014-10-06 DIAGNOSIS — N951 Menopausal and female climacteric states: Secondary | ICD-10-CM

## 2014-10-06 MED ORDER — ESTRADIOL 1 MG PO TABS: 0.5000 mg | ORAL_TABLET | Freq: Every day | ORAL | Status: DC

## 2014-10-06 NOTE — Telephone Encounter (Signed)
Rx forwarded to pharmacy.

## 2014-12-13 ENCOUNTER — Encounter: Payer: Self-pay | Admitting: *Deleted

## 2014-12-14 ENCOUNTER — Encounter: Payer: Self-pay | Admitting: Obstetrics & Gynecology

## 2015-05-03 ENCOUNTER — Other Ambulatory Visit: Payer: Self-pay | Admitting: *Deleted

## 2015-05-03 DIAGNOSIS — N951 Menopausal and female climacteric states: Secondary | ICD-10-CM

## 2015-05-03 MED ORDER — ESTRADIOL 1 MG PO TABS: 0.5000 mg | ORAL_TABLET | Freq: Every day | ORAL | Status: DC

## 2016-10-06 ENCOUNTER — Emergency Department (HOSPITAL_COMMUNITY)
Admission: EM | Admit: 2016-10-06 | Discharge: 2016-10-06 | Disposition: A | Discharge status: Home / Self Care | Payer: BLUE CROSS/BLUE SHIELD | Attending: Emergency Medicine | Admitting: Emergency Medicine

## 2016-10-06 ENCOUNTER — Emergency Department (HOSPITAL_COMMUNITY): Payer: BLUE CROSS/BLUE SHIELD

## 2016-10-06 ENCOUNTER — Encounter (HOSPITAL_COMMUNITY): Payer: Self-pay | Admitting: Emergency Medicine

## 2016-10-06 DIAGNOSIS — R112 Nausea with vomiting, unspecified: Secondary | ICD-10-CM | POA: Insufficient documentation

## 2016-10-06 DIAGNOSIS — F17210 Nicotine dependence, cigarettes, uncomplicated: Secondary | ICD-10-CM | POA: Insufficient documentation

## 2016-10-06 DIAGNOSIS — R42 Dizziness and giddiness: Secondary | ICD-10-CM | POA: Diagnosis not present

## 2016-10-06 DIAGNOSIS — E039 Hypothyroidism, unspecified: Secondary | ICD-10-CM | POA: Insufficient documentation

## 2016-10-06 LAB — CBC WITH DIFFERENTIAL/PLATELET
BASOS PCT: 0 %
Basophils Absolute: 0 10*3/uL (ref 0.0–0.1)
Eosinophils Absolute: 0.1 10*3/uL (ref 0.0–0.7)
Eosinophils Relative: 0 %
HEMATOCRIT: 42.5 % (ref 36.0–46.0)
HEMOGLOBIN: 14.5 g/dL (ref 12.0–15.0)
LYMPHS ABS: 4.1 10*3/uL — AB (ref 0.7–4.0)
LYMPHS PCT: 23 %
MCH: 33.4 pg (ref 26.0–34.0)
MCHC: 34.1 g/dL (ref 30.0–36.0)
MCV: 97.9 fL (ref 78.0–100.0)
MONO ABS: 0.7 10*3/uL (ref 0.1–1.0)
MONOS PCT: 4 %
NEUTROS ABS: 12.5 10*3/uL — AB (ref 1.7–7.7)
NEUTROS PCT: 73 %
Platelets: 228 10*3/uL (ref 150–400)
RBC: 4.34 MIL/uL (ref 3.87–5.11)
RDW: 13.4 % (ref 11.5–15.5)
WBC: 17.4 10*3/uL — ABNORMAL HIGH (ref 4.0–10.5)

## 2016-10-06 LAB — BASIC METABOLIC PANEL
Anion gap: 7 (ref 5–15)
BUN: 12 mg/dL (ref 6–20)
CALCIUM: 9 mg/dL (ref 8.9–10.3)
CHLORIDE: 104 mmol/L (ref 101–111)
CO2: 27 mmol/L (ref 22–32)
CREATININE: 0.63 mg/dL (ref 0.44–1.00)
GFR calc non Af Amer: >60 mL/min (ref >60)
GLUCOSE: 104 mg/dL — AB (ref 65–99)
Potassium: 3.6 mmol/L (ref 3.5–5.1)
Sodium: 138 mmol/L (ref 135–145)

## 2016-10-06 MED ORDER — MECLIZINE HCL 25 MG PO TABS: 25.0000 mg | ORAL_TABLET | Freq: Three times a day (TID) | ORAL | 0 refills | Status: AC | PRN

## 2016-10-06 MED ORDER — MECLIZINE HCL 12.5 MG PO TABS
25.0000 mg | ORAL_TABLET | Freq: Once | ORAL | Status: AC
  Administered 2016-10-06: 25 mg via ORAL
  Filled 2016-10-06: qty 2

## 2016-10-06 MED ORDER — SODIUM CHLORIDE 0.9 % IV BOLUS (SEPSIS)
1000.0000 mL | Freq: Once | INTRAVENOUS | Status: AC
  Administered 2016-10-06: 1000 mL via INTRAVENOUS

## 2016-10-06 NOTE — ED Provider Notes (Signed)
[facility] DEPT Provider Note   CSN: [id number] Arrival date & time: 10/06/16  1947 By signing my name below, I, Georgette Shell, attest that this documentation has been prepared under the direction and in the presence of Merrily Pew, MD. Electronically Signed: Georgette Shell, ED Scribe. 10/06/16. 8:15 PM.  History   Chief Complaint Chief Complaint  Patient presents with  . Dizziness   HPI Comments: Nancy Khan is a 50 y.o. female with no pertinent PMHx, who presents to the Emergency Department by EMS complaining of sudden onset "room-spinning" dizziness beginning just PTA with associated nausea and episodic vomiting (10-15 episodes). [place]. She states she "suddenly felt drunk". Her symptoms are exacerbated with movement and alleviated when lying still. Pt reports that she fell on 10/03/16 and struck her head against the concrete, she is concerned it may be related. She denies LOC. Denies h/o similar symptoms. Denies any recent illnesses. Pt further denies headache, leg swelling, fever, weakness, numbness, or any other associated symptoms.   The history is provided by the patient. No language interpreter was used.    Past Medical History:  Diagnosis Date  . Frequent UTI   . History of chicken pox   . Hypothyroidism 03/2012    Patient Active Problem List   Diagnosis Date Noted  . 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 Ovarian tumor (benign) 04/16/2012  . Unspecified hypothyroidism 04/16/2012    Past Surgical History:  Procedure Laterality Date  . ILEOSTOMY     Age 4  . TOTAL ABDOMINAL HYSTERECTOMY W/ BILATERAL SALPINGOOPHORECTOMY  03/14/2011  . TUMOR REMOVAL     also removed 8 liters of fluid-benign tumor-Dr. Carlisle    OB History    No data available       Home Medications    Prior to Admission medications   Medication Sig Start Date End Date Taking? Authorizing Provider  Misc Natural Products (PROGESTERONE  EX) Apply 1 application topically 2 (two) times daily. Pt applies progesterone cream to wrist.   Yes Historical Provider, MD  meclizine (ANTIVERT) 25 MG tablet Take 1 tablet (25 mg total) by mouth 3 (three) times daily as needed for dizziness. 10/06/16   Merrily Pew, MD    Family History History reviewed. No pertinent family history.  Social History Social History  Substance Use Topics  . Smoking status: Heavy Tobacco Smoker    Packs/day: 0.50    Years: 20.00    Types: Cigarettes  . Smokeless tobacco: Never Used  . Alcohol use No     Allergies   Penicillins   Review of Systems Review of Systems  Constitutional: Negative for fever.  HENT: Positive for tinnitus (baseline (left ear)).   Cardiovascular: Negative for leg swelling.  Gastrointestinal: Positive for nausea and vomiting.  Neurological: Positive for dizziness. Negative for headaches.  All other systems reviewed and are negative.    Physical Exam Updated Vital Signs BP (!) 105/47 (BP Location: Left Arm)   Pulse 76   Temp 97.4 F (36.3 C) (Oral)   Resp 17   Ht 5\' 8"  (1.727 m)   Wt 160 lb (72.6 kg)   SpO2 96%   BMI 24.33 kg/m   Physical Exam  Constitutional: She is oriented to person, place, and time. She appears well-developed and well-nourished.  HENT:  Head: Normocephalic and atraumatic.  Right Ear: Tympanic membrane and external ear normal.  Left Ear: Tympanic membrane and external ear normal.  Nose: Nose normal.  Mouth/Throat: Oropharynx is clear and moist.  Eyes: Conjunctivae and EOM are normal. Pupils are equal, round, and reactive to light.  Cardiovascular: Normal rate, regular rhythm and normal heart sounds.  Exam reveals no gallop and no friction rub.   No murmur heard. Pulmonary/Chest: Effort normal and breath sounds normal. No respiratory distress. She has no wheezes. She has no rales.  Abdominal: She exhibits no distension.  Musculoskeletal: Normal range of motion.  BUE and BLE motor  sensation intact.  Neurological: She is alert and oriented to person, place, and time. She has normal reflexes. No cranial nerve deficit.  Skin: Skin is warm and dry.  Psychiatric: She has a normal mood and affect. Her behavior is normal.  Nursing note and vitals reviewed.  ED Treatments / Results  DIAGNOSTIC STUDIES: Oxygen Saturation is 91% on RA, poor by my interpretation.    COORDINATION OF CARE: 8:14 PM Discussed treatment plan with pt at bedside which includes EKG and head CT and pt agreed to plan.  Labs (all labs ordered are listed, but only abnormal results are displayed) Labs Reviewed  CBC WITH DIFFERENTIAL/PLATELET - Abnormal; Notable for the following:       Result Value   WBC 17.4 (*)    Neutro Abs 12.5 (*)    Lymphs Abs 4.1 (*)    All other components within normal limits  BASIC METABOLIC PANEL - Abnormal; Notable for the following:    Glucose, Bld 104 (*)    All other components within normal limits    EKG  EKG Interpretation None       Radiology Ct Head Wo Contrast  Result Date: 10/06/2016 CLINICAL DATA:  50 year old female with dizziness, nausea vomiting. EXAM: CT HEAD WITHOUT CONTRAST TECHNIQUE: Contiguous axial images were obtained from the base of the skull through the vertex without intravenous contrast. COMPARISON:  Brain MRI dated 07/14/2014 FINDINGS: Brain: No evidence of acute infarction, hemorrhage, hydrocephalus, extra-axial collection or mass lesion/mass effect. Vascular: No hyperdense vessel or unexpected calcification. Skull: Normal. Negative for fracture or focal lesion. Sinuses/Orbits: No acute finding. Other: None. IMPRESSION: No acute intracranial pathology. Electronically Signed   By: Anner Crete M.D.   On: 10/06/2016 21:44    Procedures Procedures (including critical care time)  Medications Ordered in ED Medications  meclizine (ANTIVERT) tablet 25 mg (25 mg Oral Given 10/06/16 2028)  sodium chloride 0.9 % bolus 1,000 mL (0 mLs  Intravenous Stopped 10/06/16 2225)     Initial Impression / Assessment and Plan / ED Course  I have reviewed the triage vital signs and the nursing notes.  Pertinent labs & imaging results that were available during my care of the patient were reviewed by me and considered in my medical decision making (see chart for details).  Clinical Course    Likely bppv. No e/o bleed from recent fall. No metabolic/hematologic abnormalities. Improved with meclizine and fluids, ambulating with minimal symptoms. epley explained and patient will Perform them at home. She will also follow up with ENT if persistent symptoms longer than a few days. She'll return here for any new or worsening symptoms.  Final Clinical Impressions(s) / ED Diagnoses   Final diagnoses:  Vertigo    New Prescriptions New Prescriptions   MECLIZINE (ANTIVERT) 25 MG TABLET    Take 1 tablet (25 mg total) by mouth 3 (three) times daily as needed for dizziness.   I personally performed the services described in this documentation, which was scribed in my presence. The  recorded information has been reviewed and is accurate.     Merrily Pew, MD 10/06/16 [phone]

## 2016-10-06 NOTE — ED Notes (Signed)
Went to up dated vitals pt gone to CT

## 2016-10-06 NOTE — ED Triage Notes (Signed)
[place] was [place] the [place] and became dizzy with nausea and vomiting. Pt stated that she had fallen on 10/03/16 and hit her head really hard. She stated that it did not loose LOC. She had several episodes of vomiting while EMS was with her.

## 2016-10-06 NOTE — ED Notes (Signed)
Ambulated pt. To bathroom and back to her room. Pt stated that  there was very little dizziness since given the antivert.

## 2016-10-23 ENCOUNTER — Other Ambulatory Visit: Payer: Self-pay | Admitting: Otolaryngology

## 2016-10-23 DIAGNOSIS — H8301 Labyrinthitis, right ear: Secondary | ICD-10-CM

## 2016-11-01 ENCOUNTER — Ambulatory Visit
Admission: RE | Admit: 2016-11-01 | Discharge: 2016-11-01 | Disposition: A | Discharge status: Home / Self Care | Payer: BLUE CROSS/BLUE SHIELD | Source: Ambulatory Visit | Attending: Otolaryngology | Admitting: Otolaryngology

## 2016-11-01 DIAGNOSIS — H8301 Labyrinthitis, right ear: Secondary | ICD-10-CM

## 2016-11-01 MED ORDER — GADOBENATE DIMEGLUMINE 529 MG/ML IV SOLN
15.0000 mL | Freq: Once | INTRAVENOUS | Status: AC | PRN
  Administered 2016-11-01: 15 mL via INTRAVENOUS

## 2016-11-05 ENCOUNTER — Ambulatory Visit (INDEPENDENT_AMBULATORY_CARE_PROVIDER_SITE_OTHER): Payer: BLUE CROSS/BLUE SHIELD | Admitting: Certified Nurse Midwife

## 2016-11-05 ENCOUNTER — Encounter: Payer: Self-pay | Admitting: Certified Nurse Midwife

## 2016-11-05 VITALS — BP 166/95 | HR 108 | Ht 68.0 in | Wt 162.0 lb

## 2016-11-05 DIAGNOSIS — R635 Abnormal weight gain: Secondary | ICD-10-CM

## 2016-11-05 DIAGNOSIS — R102 Pelvic and perineal pain: Secondary | ICD-10-CM

## 2016-11-05 DIAGNOSIS — G8929 Other chronic pain: Secondary | ICD-10-CM | POA: Insufficient documentation

## 2016-11-05 DIAGNOSIS — R030 Elevated blood-pressure reading, without diagnosis of hypertension: Secondary | ICD-10-CM

## 2016-11-05 DIAGNOSIS — Z0000 Encounter for general adult medical examination without abnormal findings: Secondary | ICD-10-CM | POA: Diagnosis not present

## 2016-11-05 DIAGNOSIS — Z8639 Personal history of other endocrine, nutritional and metabolic disease: Secondary | ICD-10-CM

## 2016-11-05 DIAGNOSIS — Z1151 Encounter for screening for human papillomavirus (HPV): Secondary | ICD-10-CM | POA: Diagnosis not present

## 2016-11-05 DIAGNOSIS — Z124 Encounter for screening for malignant neoplasm of cervix: Secondary | ICD-10-CM | POA: Diagnosis not present

## 2016-11-05 DIAGNOSIS — Z01419 Encounter for gynecological examination (general) (routine) without abnormal findings: Secondary | ICD-10-CM

## 2016-11-05 DIAGNOSIS — R000 Tachycardia, unspecified: Secondary | ICD-10-CM

## 2016-11-05 DIAGNOSIS — F419 Anxiety disorder, unspecified: Secondary | ICD-10-CM

## 2016-11-05 DIAGNOSIS — Z1231 Encounter for screening mammogram for malignant neoplasm of breast: Secondary | ICD-10-CM

## 2016-11-05 MED ORDER — LEVOTHYROXINE SODIUM 100 MCG PO TABS: 100.0000 ug | ORAL_TABLET | Freq: Every day | ORAL | 12 refills | Status: AC

## 2016-11-05 NOTE — Progress Notes (Signed)
Subjective:      Nancy Khan is a 50 y.o. female here for a routine exam.  Current complaints: anxiety?Marland Kitchen Has not been taking her medications for hypothyroidism.  Stopped medications two years ago.    S/P hysterectomy: prolpased cervix, myomectomy, benign.  Oophorectomy.  Not currently sexually active.  Married.   Reports increased weight gain over the last 6 months. Hair is brittle, reports constipation.  Has not had a regular check up in years.  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      Personal health questionnaire:  Is patient Ashkenazi Jewish, have a family history of breast and/or ovarian cancer: no Is there a family history of uterine cancer diagnosed at age < 69, gastrointestinal cancer, urinary tract cancer, family member who is a Field seismologist syndrome-associated carrier: no Is the patient overweight and hypertensive, family history of diabetes, personal history of gestational diabetes, preeclampsia or PCOS: no Is patient over 50, have PCOS,  family history of premature CHD under age 72, diabetes, smoke, have hypertension or peripheral artery disease:  no At any time, has a partner hit, kicked or otherwise hurt or frightened you?: no Over the past 2 weeks, have you felt down, depressed or hopeless?: no Over the past 2 weeks, have you felt little interest or pleasure in doing things?:no   Gynecologic History No LMP recorded. Patient has had a hysterectomy. Contraception: status post hysterectomy Last Pap: 06/15/13. Results were: normal Last mammogram: 5-6 years ago. Results were: normal  Obstetric History OB History  No data available    Past Medical History:  Diagnosis Date  . Frequent UTI   . History of chicken pox   . Hypothyroidism 03/2012    Past Surgical History:  Procedure Laterality Date  . ILEOSTOMY     Age 38  . TOTAL ABDOMINAL HYSTERECTOMY W/ BILATERAL SALPINGOOPHORECTOMY  03/14/2011  . TUMOR REMOVAL     also removed 8 liters of fluid-benign tumor-Dr. Janie Morning [facility]     Current Outpatient Prescriptions:  .  levothyroxine (SYNTHROID) 100 MCG tablet, Take 1 tablet (100 mcg total) by mouth daily before breakfast., Disp: 30 tablet, Rfl: 12 .  meclizine (ANTIVERT) 25 MG tablet, Take 1 tablet (25 mg total) by mouth 3 (three) times daily as needed for dizziness. (Patient not taking: Reported on 11/05/2016), Disp: 30 tablet, Rfl: 0 .  Misc Natural Products (PROGESTERONE EX), Apply 1 application topically 2 (two) times daily. Pt applies progesterone cream to wrist., Disp: , Rfl:  Allergies  Allergen Reactions  . Penicillins Hives and Other (See Comments)    Has patient had a PCN reaction causing immediate rash, facial/tongue/throat swelling, SOB or lightheadedness with hypotension: No Has patient had a PCN reaction causing severe rash involving mucus membranes or skin necrosis: No Has patient had a PCN reaction that required hospitalization No Has patient had a PCN reaction occurring within the last 10 years: No If all of the above answers are "NO", then may proceed with Cephalosporin use.    Social History  Substance Use Topics  . Smoking status: Former Smoker    Packs/day: 0.50    Years: 20.00    Types: Cigarettes  . Smokeless tobacco: Never Used  . Alcohol use No    No family history on file.    Review of Systems  Constitutional: + for fatigue and  Negative for weight loss, + weight gain Respiratory: negative for cough and wheezing Cardiovascular: negative for chest pain, + for fatigue and  palpitations Gastrointestinal: negative for abdominal pain and change in bowel habits Musculoskeletal:negative for myalgias Neurological: negative for gait problems and tremors Behavioral/Psych: negative for abusive relationship, depression Endocrine: negative for temperature intolerance    Genitourinary:negative for abnormal menstrual periods, genital lesions, hot flashes, sexual problems and vaginal discharge, + vaginal  pain Integument/breast: negative for breast lump, breast tenderness, nipple discharge and skin lesion(s)    Objective:       BP (!) 166/95   Pulse (!) 108   Ht 5\' 8"  (1.727 m)   Wt 162 lb (73.5 kg)   BMI 24.63 kg/m  General:   alert  Skin:   no rash or abnormalities  Lungs:   clear to auscultation bilaterally  Heart:   regular rate and rhythm, S1, S2 normal, no murmur, click, rub or gallop, tachycardia noted  Breasts:   normal without suspicious masses, skin or nipple changes or axillary nodes  Abdomen:  normal findings: no organomegaly, soft, non-tender and no hernia  Pelvis:  External genitalia: normal general appearance Urinary system: urethral meatus normal and bladder without fullness, nontender, mesh in place.   Vaginal: normal without tenderness, induration or masses Cervix: cuff intact Adnexa: surgically absent Uterus: surgically absent   Lab Review Urine pregnancy test Labs reviewed yes Radiologic studies reviewed yes  50% of 30 min visit spent on counseling and coordination of care.    Assessment:    Healthy female exam.   Anxiety  Lack of health maintenance  Hypothyroidism  elevated blood pressure/tachycardia most likely r/t thyroid issues  S/P hysterectomy/oophrectomy  pelvic/vaginal pain chronic   Plan:   Encouraged to seek ER care if symptoms worsen  Case discussed with Dr. Jodi Mourning  Education reviewed: calcium supplements, depression evaluation, low fat, low cholesterol diet, safe sex/STD prevention, self breast exams, skin cancer screening and weight bearing exercise. Contraception: status post hysterectomy. Mammogram ordered. Follow up in: 1 year.   Meds ordered this encounter  Medications  . levothyroxine (SYNTHROID) 100 MCG tablet    Sig: Take 1 tablet (100 mcg total) by mouth daily before breakfast.    Dispense:  30 tablet    Refill:  12   Orders Placed This Encounter  Procedures  . MM DIGITAL SCREENING BILATERAL    Standing Status:    Future    Standing Expiration Date:   01/05/2018    Order Specific Question:   Reason for Exam (SYMPTOM  OR DIAGNOSIS REQUIRED)    Answer:   annual exam    Order Specific Question:   Is the patient pregnant?    Answer:   No    Order Specific Question:   Preferred imaging location?    Answer:   [facility]  . Thyroid Panel With TSH  . HgB A1c  . CMP14+CBC/D/Plt+TSH  . Ambulatory referral to Endocrinology    Referral Priority:   Urgent    Referral Type:   Consultation    Referral Reason:   Specialty Services Required    Number of Visits Requested:   1   Need to obtain previous records Possible management options include: Dr. [place] Pink for vaginal pain Follow up as needed.

## 2016-11-05 NOTE — Addendum Note (Signed)
Addended by: Lewie Loron D on: 11/05/2016 05:04 PM   Modules accepted: Orders

## 2016-11-06 LAB — CMP14+CBC/D/PLT+TSH
ALBUMIN: 4.7 g/dL (ref 3.5–5.5)
ALK PHOS: 129 IU/L — AB (ref 39–117)
ALT: 23 IU/L (ref 0–32)
AST: 17 IU/L (ref 0–40)
Albumin/Globulin Ratio: 1.7 (ref 1.2–2.2)
BASOS: 0 %
BUN / CREAT RATIO: 8 — AB (ref 9–23)
BUN: 5 mg/dL — ABNORMAL LOW (ref 6–24)
Basophils Absolute: 0 10*3/uL (ref 0.0–0.2)
Bilirubin Total: 0.2 mg/dL (ref 0.0–1.2)
CO2: 22 mmol/L (ref 18–29)
CREATININE: 0.65 mg/dL (ref 0.57–1.00)
Calcium: 9.6 mg/dL (ref 8.7–10.2)
Chloride: 98 mmol/L (ref 96–106)
EOS (ABSOLUTE): 0 10*3/uL (ref 0.0–0.4)
EOS: 0 %
GFR, EST AFRICAN AMERICAN: 118 mL/min/{1.73_m2} (ref >59)
GFR, EST NON AFRICAN AMERICAN: 102 mL/min/{1.73_m2} (ref >59)
GLOBULIN, TOTAL: 2.7 g/dL (ref 1.5–4.5)
Glucose: 98 mg/dL (ref 65–99)
HEMATOCRIT: 44.7 % (ref 34.0–46.6)
HEMOGLOBIN: 15.1 g/dL (ref 11.1–15.9)
IMMATURE GRANS (ABS): 0 10*3/uL (ref 0.0–0.1)
IMMATURE GRANULOCYTES: 0 %
LYMPHS: 24 %
Lymphocytes Absolute: 2.3 10*3/uL (ref 0.7–3.1)
MCH: 32.5 pg (ref 26.6–33.0)
MCHC: 33.8 g/dL (ref 31.5–35.7)
MCV: 96 fL (ref 79–97)
MONOCYTES: 5 %
MONOS ABS: 0.5 10*3/uL (ref 0.1–0.9)
NEUTROS PCT: 71 %
Neutrophils Absolute: 6.6 10*3/uL (ref 1.4–7.0)
Platelets: 303 10*3/uL (ref 150–379)
Potassium: 3.6 mmol/L (ref 3.5–5.2)
RBC: 4.64 x10E6/uL (ref 3.77–5.28)
RDW: 14 % (ref 12.3–15.4)
SODIUM: 141 mmol/L (ref 134–144)
TOTAL PROTEIN: 7.4 g/dL (ref 6.0–8.5)
TSH: 16.86 u[IU]/mL — AB (ref 0.450–4.500)
WBC: 9.5 10*3/uL (ref 3.4–10.8)

## 2016-11-06 LAB — HEMOGLOBIN A1C
Est. average glucose Bld gHb Est-mCnc: 111 mg/dL
Hgb A1c MFr Bld: 5.5 % (ref 4.8–5.6)

## 2016-11-06 LAB — THYROID PANEL WITH TSH
FREE THYROXINE INDEX: 1.6 (ref 1.2–4.9)
T3 UPTAKE RATIO: 24 % (ref 24–39)
T4, Total: 6.8 ug/dL (ref 4.5–12.0)

## 2016-11-11 LAB — CYTOLOGY - PAP
DIAGNOSIS: NEGATIVE
HPV (WINDOPATH): NOT DETECTED

## 2016-11-12 LAB — NUSWAB VG, CANDIDA 6SP
CANDIDA ALBICANS, NAA: NEGATIVE
CANDIDA KRUSEI, NAA: NEGATIVE
CANDIDA LUSITANIAE, NAA: NEGATIVE
CANDIDA PARAPSILOSIS, NAA: NEGATIVE
Candida glabrata, NAA: NEGATIVE
Candida tropicalis, NAA: NEGATIVE
Trich vag by NAA: NEGATIVE

## 2016-11-14 ENCOUNTER — Other Ambulatory Visit: Payer: Self-pay | Admitting: Certified Nurse Midwife

## 2016-11-14 ENCOUNTER — Telehealth: Payer: Self-pay | Admitting: *Deleted

## 2016-11-14 NOTE — Telephone Encounter (Signed)
Patient is calling- she is upset she did not get a call about her test results. She has also not gotten her referrals either.

## 2016-11-14 NOTE — Telephone Encounter (Signed)
Please tell her that they were normal.  Please check on referrals with Annetra  Thank you.

## 2016-11-15 NOTE — Telephone Encounter (Signed)
Spoke to patient-  Notified of her test results. Will check with Annetra about her mammogram and endocrine referrals.

## 2016-12-24 ENCOUNTER — Ambulatory Visit: Payer: BLUE CROSS/BLUE SHIELD

## 2017-01-07 ENCOUNTER — Ambulatory Visit: Payer: BLUE CROSS/BLUE SHIELD

## 2018-01-09 IMAGING — MR MR HEAD WO/W CM
10 of 11 series · 32 of 48 positions shown · IV contrast (15 ml Multihance)
Comparison: 07/14/2014

CLINICAL DATA: Acute labyrinthitis on the right. Dizziness and
vertigo for 3 weeks with ringing in the right ear and some hearing
loss. Fall from ladder 2 days before symptoms.

EXAM:
MRI HEAD WITHOUT AND WITH CONTRAST
TECHNIQUE: Multiplanar, multiecho pulse sequences of the brain and surrounding
structures were obtained without and with intravenous contrast.
CONTRAST:  15mL MULTIHANCE GADOBENATE DIMEGLUMINE 529 MG/ML IV SOLN

[Series 2: T1 · sagittal · 5.0mm · 0.45mm/px · 4 of 21 slices shown (1 of 3)]
[im 1/21]
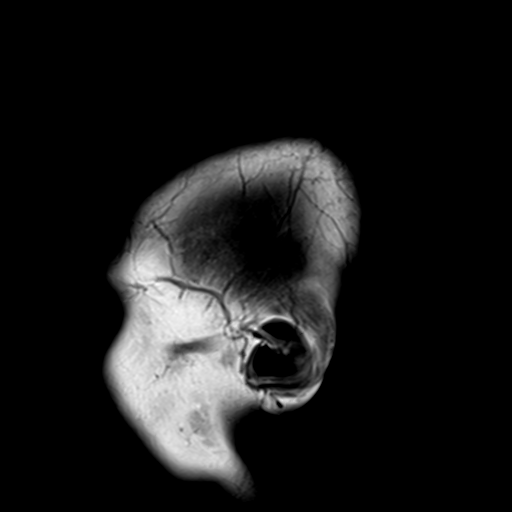
[im 7/21]
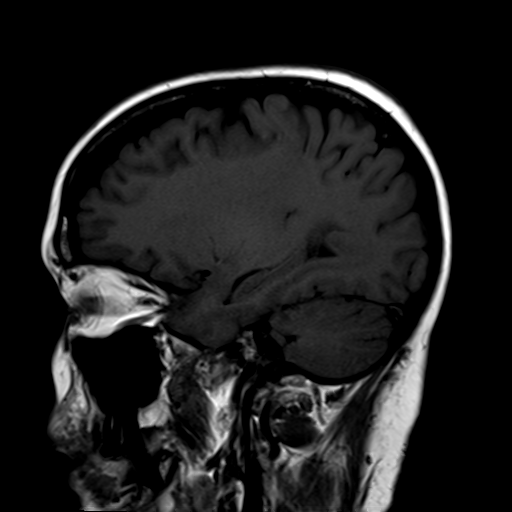
[im 14/21]
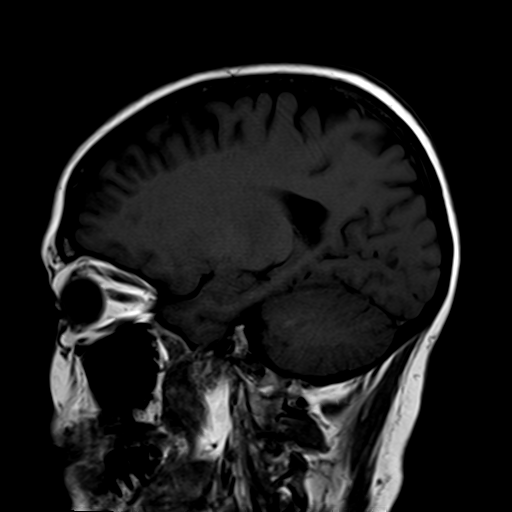
[im 21/21]
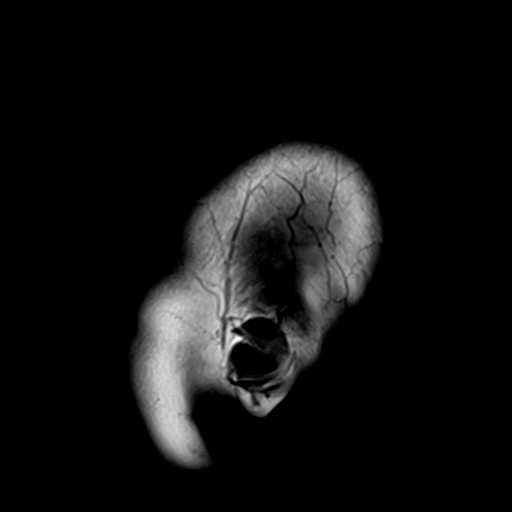

[Series 3: DWI · axial · 3.0mm · 1.80mm/px · z∈[-53,+94]mm · 8 of 100 slices shown (1 of 2)]
[im 1/100]
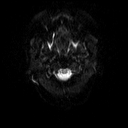
[im 16/100]
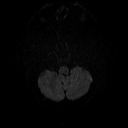
[im 31/100]
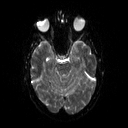
[im 46/100]
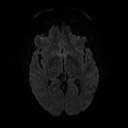
[im 54/100]
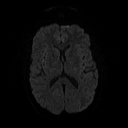
[im 69/100]
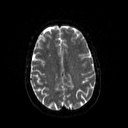
[im 84/100]
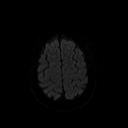
[im 100/100]
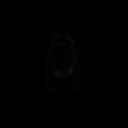

[Series 4: DWI · axial · 3.0mm · 1.80mm/px · z∈[-53,+94]mm · 6 of 47 slices shown (2 of 2)]
[im 1/47]
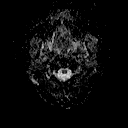
[im 10/47]
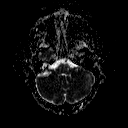
[im 19/47]
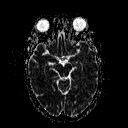
[im 28/47]
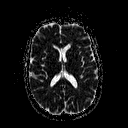
[im 37/47]
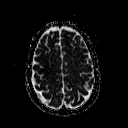
[im 47/47]
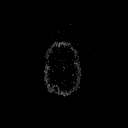

[Series 5: T2 · axial · 5.0mm · 0.45mm/px · z∈[-49,+94]mm · 3 of 23 slices shown]
[im 1/23]
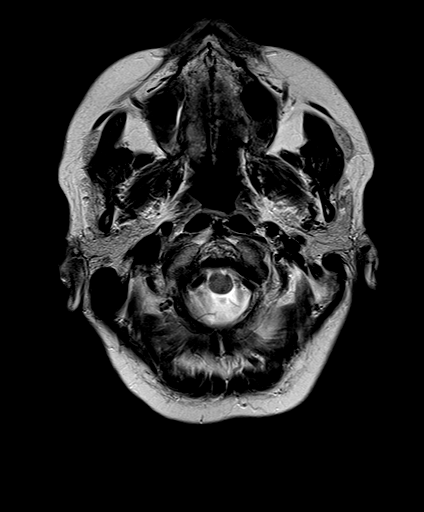
[im 12/23]
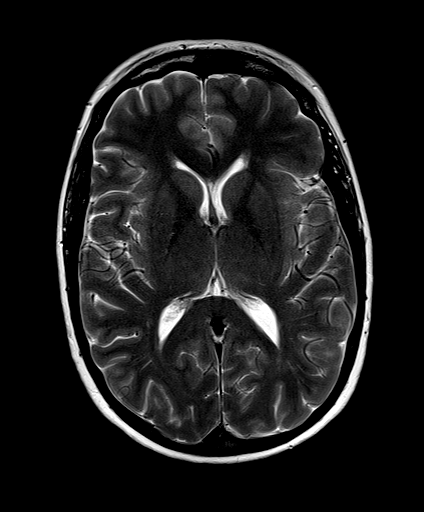
[im 23/23]
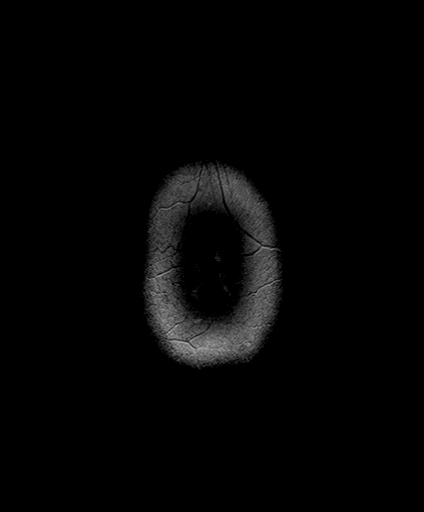

[Series 6: FLAIR · axial · 5.0mm · 0.45mm/px · z∈[-49,+94]mm · 3 of 23 slices shown]
[im 1/23]
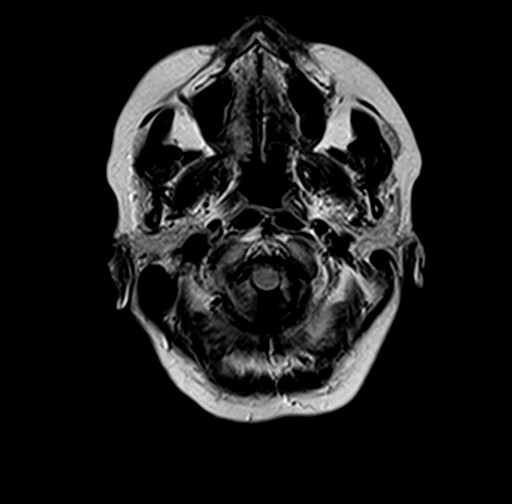
[im 12/23]
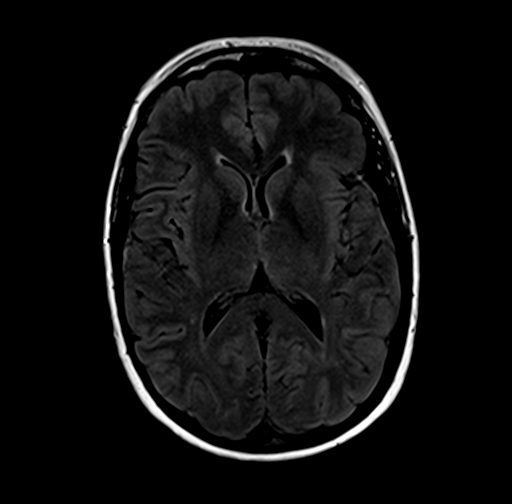
[im 23/23]
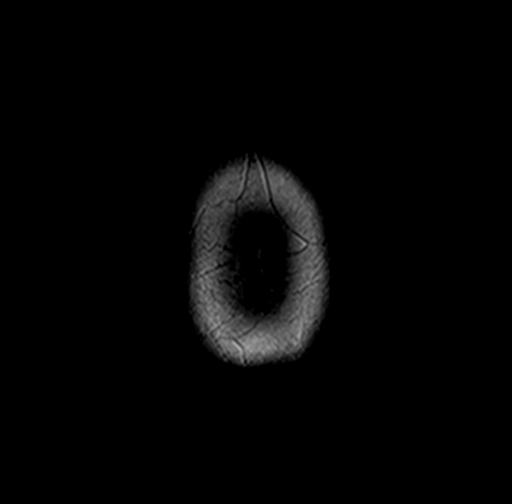

[Series 7: T1 · coronal · 3.0mm · 0.35mm/px · 1 of 11 slices shown (2 of 3)]
[im 1/11]
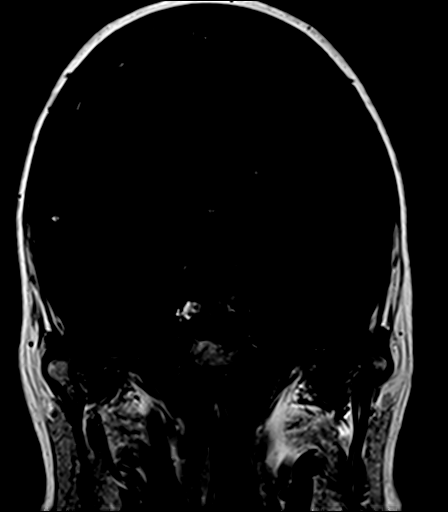

[Series 8: T1 · axial · 3.0mm · 0.35mm/px · 1 of 11 slices shown (3 of 3)]
[im 1/11]
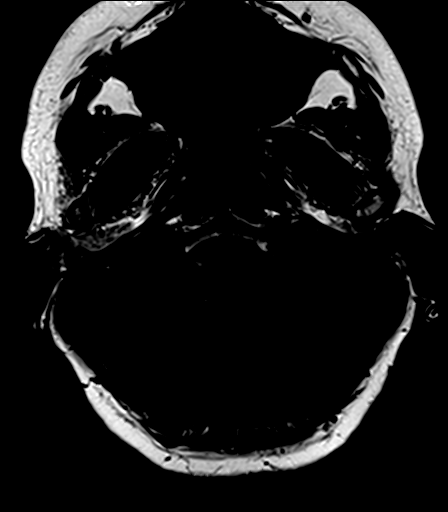

[Series 9: bSSFP · axial · 1.0mm · 0.28mm/px · z∈[-42,-16]mm · 4 of 36 slices shown]
[im 1/36]
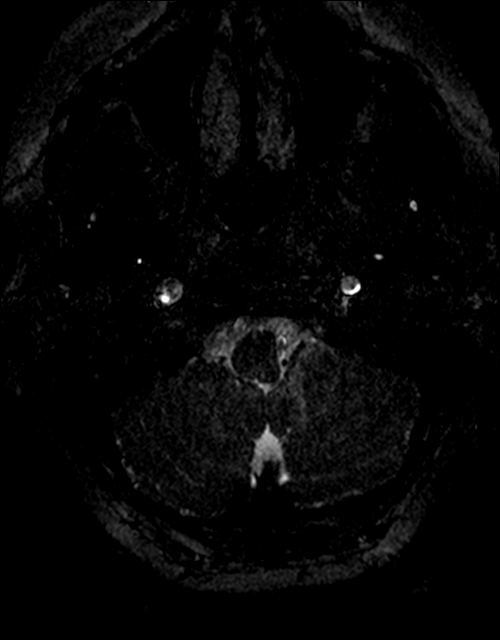
[im 9/36]
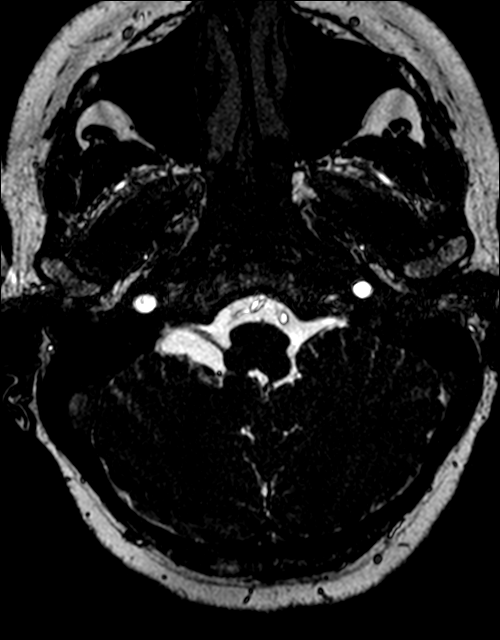
[im 18/36]
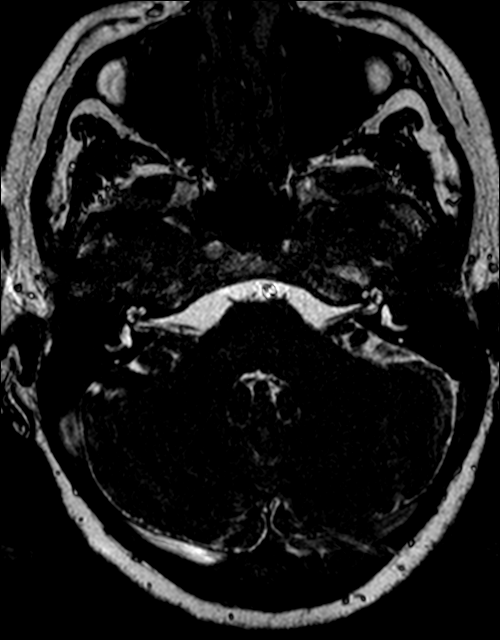
[im 27/36]
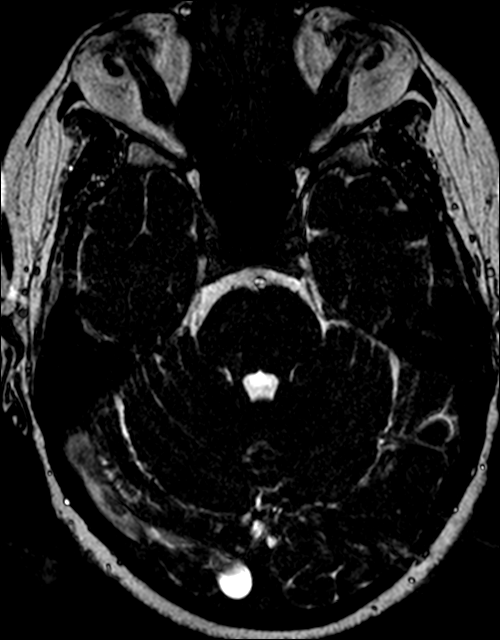

[Series 10: T1 post-contrast · coronal · 3.0mm · 0.35mm/px · 1 of 11 slices shown (1 of 2)]
[im 1/11]
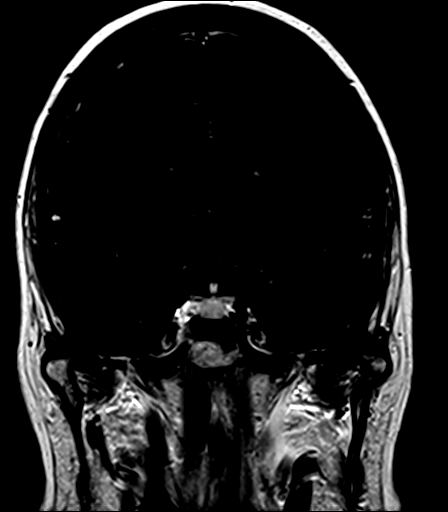

[Series 11: T1 post-contrast · axial · 3.0mm · 0.35mm/px · 1 of 11 slices shown (2 of 2)]
[im 1/11]
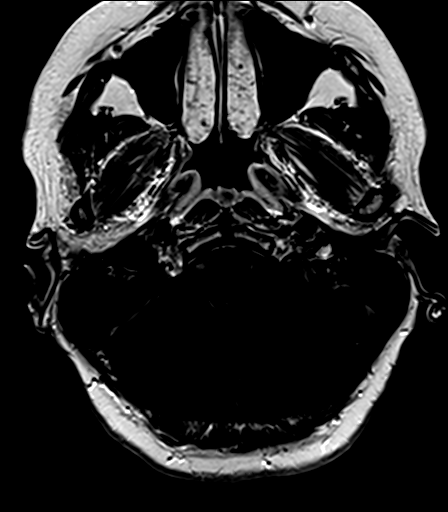

[32 of 48 positions shown; findings below may reference images not displayed]

FINDINGS: Brain: No imaging correlate for right labyrinthitis. No asymmetric
labyrinthine signal or enhancement to suggest inflammation or hemo
labyrinth. There is a 19 mm ovoid fluid collection in the low right
CP angle cistern which does not affect the vestibular cochlear
nerves. The cyst has a barely perceptible wall anteriorly with no
enhancing components, restricted diffusion, or proteinaceous
contents. This is likely an arachnoid cyst. This cyst is near the
right foramina Luschka without widening. This is best seen on T2
weighted imaging and steady state free precession (series 9). No
evidence of vestibular cochlear neuritis or mass. Normal appearance
of the brainstem.

Rare FLAIR hyperintensities in the cerebral white matter that are
nonspecific and considered incidental. No acute or remote infarct,
hemorrhage, hydrocephalus, or mass.

Vascular: Normal flow voids

Skull and upper cervical spine: Negative

Sinuses/Orbits: Negative
IMPRESSION: 1. No imaging correlate for patient's right labyrinthitis.
2. 2 cm low right CP angle arachnoid cyst. No mass effect on the
vestibular cochlear nerves.

## 2018-01-29 ENCOUNTER — Ambulatory Visit: Payer: BLUE CROSS/BLUE SHIELD | Admitting: Certified Nurse Midwife

## 2018-03-03 ENCOUNTER — Encounter: Payer: Self-pay | Admitting: *Deleted

## 2022-09-28 LAB — COLOGUARD: COLOGUARD: POSITIVE — AB

## 2024-08-28 ENCOUNTER — Other Ambulatory Visit: Payer: Self-pay

## 2024-08-28 ENCOUNTER — Encounter (HOSPITAL_BASED_OUTPATIENT_CLINIC_OR_DEPARTMENT_OTHER): Payer: Self-pay

## 2024-08-28 ENCOUNTER — Emergency Department (HOSPITAL_BASED_OUTPATIENT_CLINIC_OR_DEPARTMENT_OTHER): Payer: Self-pay | Admitting: Radiology

## 2024-08-28 ENCOUNTER — Emergency Department (HOSPITAL_BASED_OUTPATIENT_CLINIC_OR_DEPARTMENT_OTHER)
Admission: EM | Admit: 2024-08-28 | Discharge: 2024-08-28 | Disposition: A | Discharge status: Home / Self Care | Attending: Emergency Medicine | Admitting: Emergency Medicine

## 2024-08-28 DIAGNOSIS — W1839XA Other fall on same level, initial encounter: Secondary | ICD-10-CM | POA: Insufficient documentation

## 2024-08-28 DIAGNOSIS — S82002A Unspecified fracture of left patella, initial encounter for closed fracture: Secondary | ICD-10-CM | POA: Diagnosis not present

## 2024-08-28 DIAGNOSIS — S80912A Unspecified superficial injury of left knee, initial encounter: Secondary | ICD-10-CM | POA: Diagnosis present

## 2024-08-28 MED ORDER — HYDROCODONE-ACETAMINOPHEN 5-325 MG PO TABS: 1.0000 | ORAL_TABLET | ORAL | 0 refills | Status: AC | PRN

## 2024-08-28 MED ORDER — OXYCODONE-ACETAMINOPHEN 5-325 MG PO TABS
1.0000 | ORAL_TABLET | Freq: Once | ORAL | Status: AC
  Administered 2024-08-28: 1 via ORAL
  Filled 2024-08-28: qty 1

## 2024-08-28 NOTE — Discharge Instructions (Signed)
 Use the immobilizer to reduce movement of the knee. Use crutches to be weight bearing as tolerated. Ice to reduce swelling. Take ibuprofen for pain, and Norco for severe pain as directed.   Follow up with orthopedics next week. You have been given a referral to Dr. Sherida, who is on-call, however, can follow up with any orthopedist of your choice.
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
